# Patient Record
Sex: Male | Born: 1955 | Race: White | Hispanic: No | Marital: Married | Smoking: Never smoker
Health system: Southern US, Community
[De-identification: ages and names within clinical notes are randomized; demographics above are authoritative.]

## PROBLEM LIST (undated history)

## (undated) DIAGNOSIS — E119 Type 2 diabetes mellitus without complications: Secondary | ICD-10-CM

## (undated) HISTORY — PX: CERVICAL SPINE SURGERY: SHX589

## (undated) HISTORY — PX: BACK SURGERY: SHX140

---

## 2003-09-12 ENCOUNTER — Inpatient Hospital Stay (HOSPITAL_COMMUNITY): Admission: AC | Admit: 2003-09-12 | Discharge: 2003-09-15 | Payer: Self-pay

## 2003-09-15 ENCOUNTER — Inpatient Hospital Stay (HOSPITAL_COMMUNITY)
Admission: RE | Admit: 2003-09-15 | Discharge: 2003-10-21 | Payer: Self-pay | Admitting: Physical Medicine & Rehabilitation

## 2003-11-19 ENCOUNTER — Encounter
Admission: RE | Admit: 2003-11-19 | Discharge: 2004-02-17 | Payer: Self-pay | Admitting: Physical Medicine & Rehabilitation

## 2004-02-17 ENCOUNTER — Encounter
Admission: RE | Admit: 2004-02-17 | Discharge: 2004-05-17 | Payer: Self-pay | Admitting: Physical Medicine & Rehabilitation

## 2004-07-05 ENCOUNTER — Encounter
Admission: RE | Admit: 2004-07-05 | Discharge: 2004-10-03 | Payer: Self-pay | Admitting: Physical Medicine & Rehabilitation

## 2004-07-06 ENCOUNTER — Ambulatory Visit: Payer: Self-pay | Admitting: Physical Medicine & Rehabilitation

## 2004-10-31 ENCOUNTER — Encounter
Admission: RE | Admit: 2004-10-31 | Discharge: 2005-01-29 | Payer: Self-pay | Admitting: Physical Medicine & Rehabilitation

## 2004-12-28 ENCOUNTER — Ambulatory Visit: Payer: Self-pay | Admitting: Physical Medicine & Rehabilitation

## 2005-02-21 ENCOUNTER — Encounter
Admission: RE | Admit: 2005-02-21 | Discharge: 2005-05-22 | Payer: Self-pay | Admitting: Physical Medicine & Rehabilitation

## 2005-02-22 ENCOUNTER — Ambulatory Visit: Payer: Self-pay | Admitting: Physical Medicine & Rehabilitation

## 2005-09-24 IMAGING — CT CT CERVICAL SPINE W/O CM
2 of 5 series · 9 of 20 positions shown, 11 images · IV contrast (omnipaque)
Comparison: none

CLINICAL DATA: Trauma.  Paraplegia.
CERVICAL SPINE, ONE VIEW
Cross table lateral and swimmer?s views were obtained.
On the swimmer?s view, there is approximately  6 mm of anterior subluxation of C6 on C7 compatible with a  dislocation.  I don?t identify definite fractures on this view, but a CT scan is suggested.  The remainder of the alignment is normal.
IMPRESSION 
Subluxation of C6 on C7, most likely due to fracture or subluxation.  CT scanning suggested.
THORACIC SPINE, ONE VIEW
Cross table lateral views obtained on the backboard.  Normal alignment.  No fracture.
Negative.
CT BRAIN WITHOUT CONTRAST
Routine noncontrast head CT was performed.  No comparison.
There is no evidence of intracranial hemorrhage, brain edema, or mass effect.  The ventricles are normal.  No extra-axial fluid abnormalities are identified.  Bone window images show no significant abnormalities.
IMPRESSION
Negative noncontrast head CT.
CT OF THE CERVICAL SPINE
There is anterior subluxation of C6 on C7 of approximately 8 mm.  There is a fracture of the superior articulating facet of C7 with a jumped facet at C6-7 on the right.  The facet on the left is also jumped of C6 on C7 without definite fracture.  There is a moderately large osteophyte posteriorly at C7 projecting into the canal and further contributing to spinal stenosis.  There is also disc degeneration and spondylosis at C5-6.  No vertebral body fracture is seen.
8 mm of anterior subluxation of C6 on C7 with bilateral jumped facets and a fracture of the right facet at C6-7.  
Disc degeneration and spondylosis at C5-6 and C6-7.
CT MULTIPLANAR RECONSTRUCTION CERVICAL SPINE
Multiplanar reformatted CT images were reconstructed from the axial CT data set.  These images were reviewed and pertinent findings are included in the accompanying complete CT report.
See complete CT report.
CT ABDOMEN WITH CONTRAST
100 cc Omnipaque 300 IV.  No comparison.
Liver, spleen, pancreas, and kidneys are normal.  There is no free fluid.
CT PELVIS WITH CONTRAST
No comparison.
There is no free fluid.  The urinary bladder is distended.  There is no hematoma or adenopathy.
[REDACTED]

[Series 104: helical c-spine · axial · 0.39mm/px · z∈[-110,+73]mm · 8 of 380 slices shown, 10 images]
[im 43/380  soft-tissue]
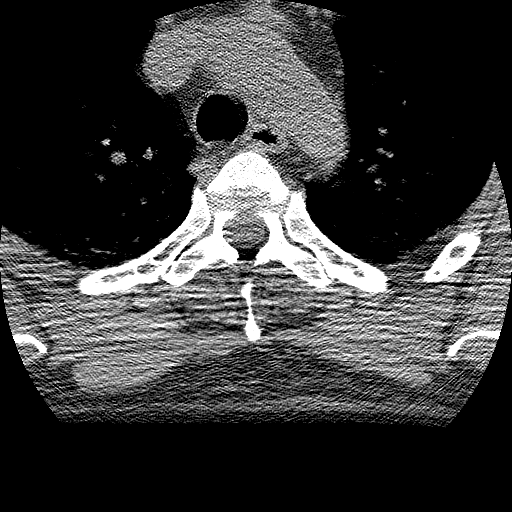
[im 43/380  bone]
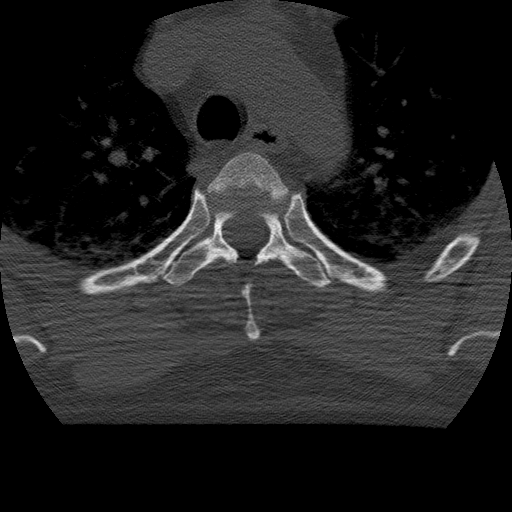
[im 85/380  bone]
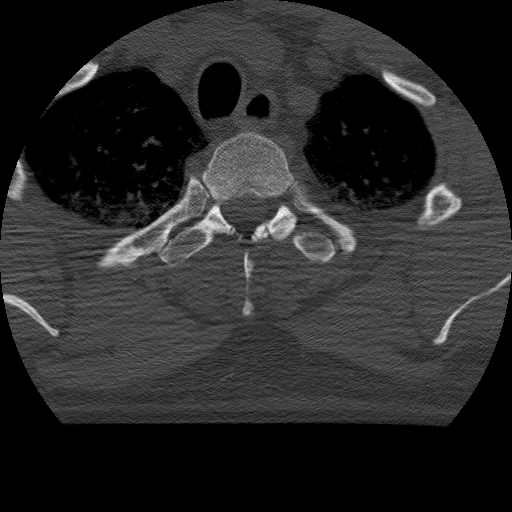
[im 127/380  bone]
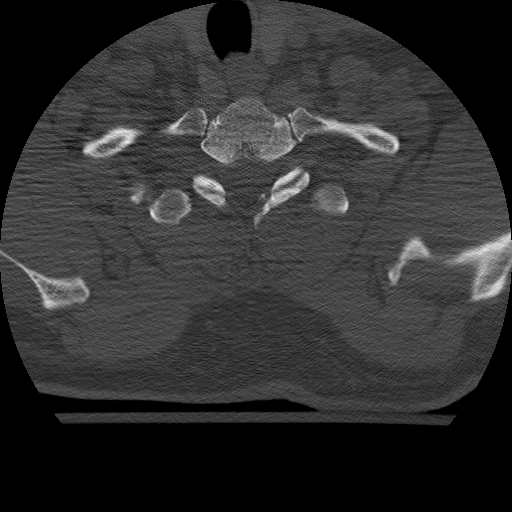
[im 169/380  bone]
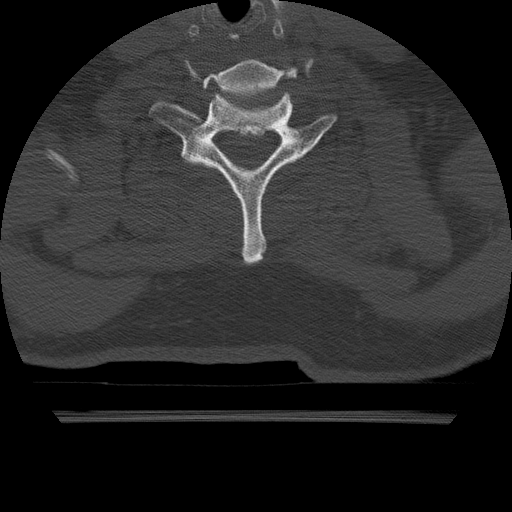
[im 211/380  soft-tissue]
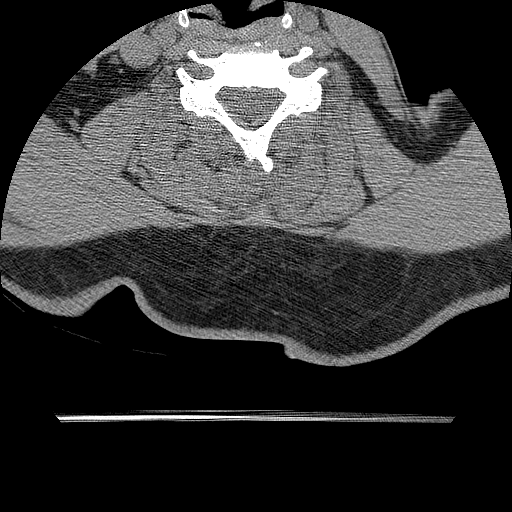
[im 211/380  bone]
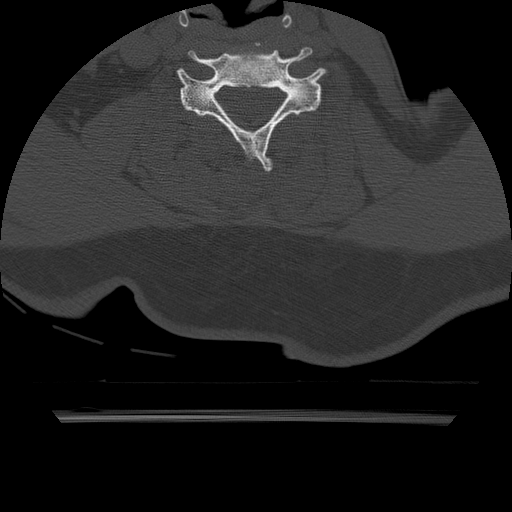
[im 253/380  bone]
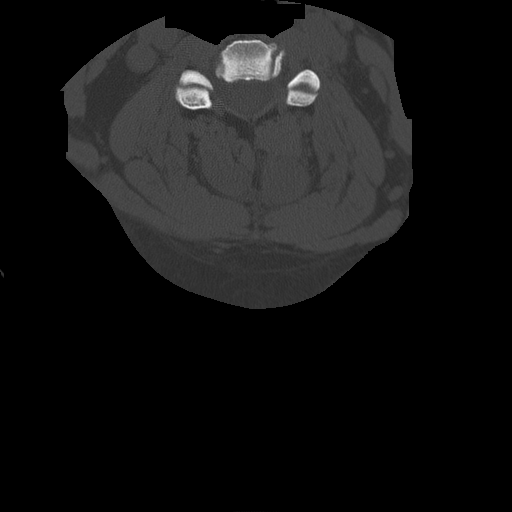
[im 295/380  bone]
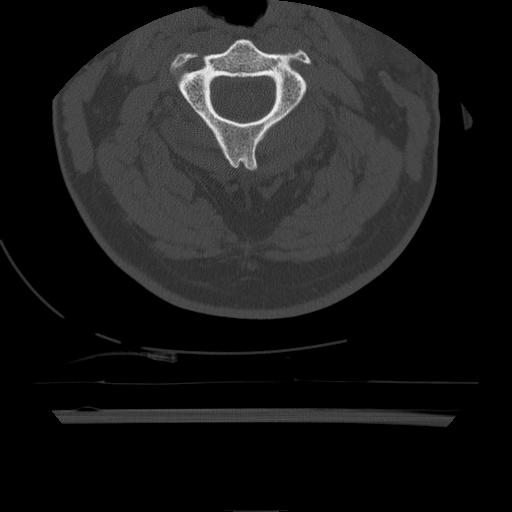
[im 337/380  bone]
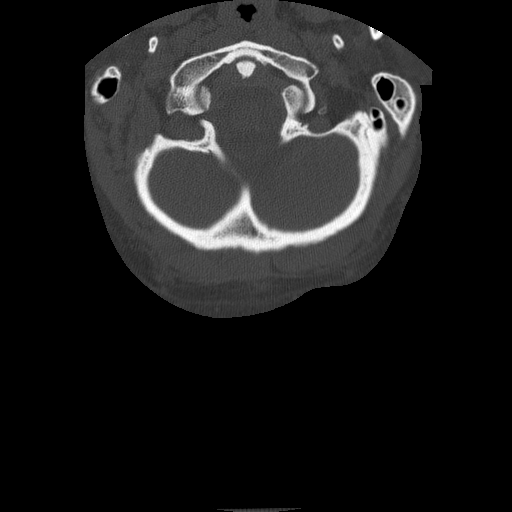

[Series 802: reformatted · coronal · 0.46mm/px · 1 of 40 slices shown]
[im 20/40  bone]
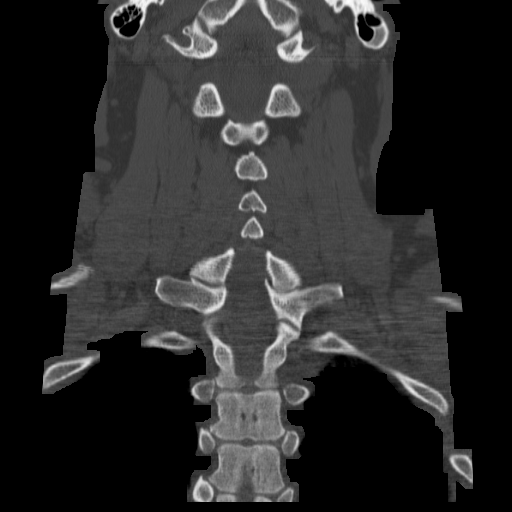

[9 of 20 positions shown; findings below may reference images not displayed]

## 2005-10-18 ENCOUNTER — Ambulatory Visit (HOSPITAL_BASED_OUTPATIENT_CLINIC_OR_DEPARTMENT_OTHER): Admission: RE | Admit: 2005-10-18 | Discharge: 2005-10-18 | Payer: Self-pay | Admitting: Urology

## 2019-02-17 ENCOUNTER — Encounter (HOSPITAL_COMMUNITY): Payer: Self-pay

## 2019-02-17 ENCOUNTER — Other Ambulatory Visit: Payer: Self-pay

## 2019-02-17 ENCOUNTER — Inpatient Hospital Stay (HOSPITAL_COMMUNITY)
Admission: EM | Admit: 2019-02-17 | Discharge: 2019-02-21 | DRG: 378 | Disposition: A | Discharge status: Home / Self Care | Payer: BC Managed Care – PPO | Attending: Internal Medicine | Admitting: Internal Medicine

## 2019-02-17 ENCOUNTER — Inpatient Hospital Stay (HOSPITAL_COMMUNITY): Payer: BC Managed Care – PPO

## 2019-02-17 DIAGNOSIS — I10 Essential (primary) hypertension: Secondary | ICD-10-CM | POA: Diagnosis present

## 2019-02-17 DIAGNOSIS — E611 Iron deficiency: Secondary | ICD-10-CM | POA: Diagnosis present

## 2019-02-17 DIAGNOSIS — K264 Chronic or unspecified duodenal ulcer with hemorrhage: Secondary | ICD-10-CM | POA: Diagnosis present

## 2019-02-17 DIAGNOSIS — Z7984 Long term (current) use of oral hypoglycemic drugs: Secondary | ICD-10-CM | POA: Diagnosis not present

## 2019-02-17 DIAGNOSIS — Z79899 Other long term (current) drug therapy: Secondary | ICD-10-CM

## 2019-02-17 DIAGNOSIS — E119 Type 2 diabetes mellitus without complications: Secondary | ICD-10-CM | POA: Diagnosis present

## 2019-02-17 DIAGNOSIS — I361 Nonrheumatic tricuspid (valve) insufficiency: Secondary | ICD-10-CM

## 2019-02-17 DIAGNOSIS — F419 Anxiety disorder, unspecified: Secondary | ICD-10-CM | POA: Diagnosis present

## 2019-02-17 DIAGNOSIS — R0989 Other specified symptoms and signs involving the circulatory and respiratory systems: Secondary | ICD-10-CM | POA: Diagnosis present

## 2019-02-17 DIAGNOSIS — E782 Mixed hyperlipidemia: Secondary | ICD-10-CM

## 2019-02-17 DIAGNOSIS — K449 Diaphragmatic hernia without obstruction or gangrene: Secondary | ICD-10-CM | POA: Diagnosis not present

## 2019-02-17 DIAGNOSIS — K921 Melena: Secondary | ICD-10-CM

## 2019-02-17 DIAGNOSIS — K259 Gastric ulcer, unspecified as acute or chronic, without hemorrhage or perforation: Secondary | ICD-10-CM | POA: Diagnosis present

## 2019-02-17 DIAGNOSIS — R0789 Other chest pain: Secondary | ICD-10-CM | POA: Diagnosis present

## 2019-02-17 DIAGNOSIS — G8929 Other chronic pain: Secondary | ICD-10-CM | POA: Diagnosis present

## 2019-02-17 DIAGNOSIS — R079 Chest pain, unspecified: Secondary | ICD-10-CM

## 2019-02-17 DIAGNOSIS — Z7982 Long term (current) use of aspirin: Secondary | ICD-10-CM

## 2019-02-17 DIAGNOSIS — D62 Acute posthemorrhagic anemia: Secondary | ICD-10-CM | POA: Diagnosis present

## 2019-02-17 DIAGNOSIS — K219 Gastro-esophageal reflux disease without esophagitis: Secondary | ICD-10-CM | POA: Diagnosis present

## 2019-02-17 DIAGNOSIS — R531 Weakness: Secondary | ICD-10-CM | POA: Diagnosis present

## 2019-02-17 DIAGNOSIS — F329 Major depressive disorder, single episode, unspecified: Secondary | ICD-10-CM | POA: Diagnosis present

## 2019-02-17 DIAGNOSIS — K922 Gastrointestinal hemorrhage, unspecified: Secondary | ICD-10-CM | POA: Diagnosis not present

## 2019-02-17 DIAGNOSIS — R072 Precordial pain: Secondary | ICD-10-CM | POA: Diagnosis not present

## 2019-02-17 DIAGNOSIS — Z87891 Personal history of nicotine dependence: Secondary | ICD-10-CM | POA: Diagnosis not present

## 2019-02-17 DIAGNOSIS — K5900 Constipation, unspecified: Secondary | ICD-10-CM | POA: Diagnosis not present

## 2019-02-17 DIAGNOSIS — I340 Nonrheumatic mitral (valve) insufficiency: Secondary | ICD-10-CM

## 2019-02-17 DIAGNOSIS — Z1159 Encounter for screening for other viral diseases: Secondary | ICD-10-CM

## 2019-02-17 DIAGNOSIS — M545 Low back pain: Secondary | ICD-10-CM | POA: Diagnosis present

## 2019-02-17 DIAGNOSIS — D649 Anemia, unspecified: Secondary | ICD-10-CM

## 2019-02-17 HISTORY — DX: Type 2 diabetes mellitus without complications: E11.9

## 2019-02-17 LAB — CBC WITH DIFFERENTIAL/PLATELET
Abs Immature Granulocytes: 0.08 10*3/uL — ABNORMAL HIGH (ref 0.00–0.07)
Basophils Absolute: 0 10*3/uL (ref 0.0–0.1)
Basophils Relative: 0 %
Eosinophils Absolute: 0 10*3/uL (ref 0.0–0.5)
Eosinophils Relative: 0 %
HCT: 16.4 % — ABNORMAL LOW (ref 39.0–52.0)
Hemoglobin: 4.7 g/dL — CL (ref 13.0–17.0)
Immature Granulocytes: 1 %
Lymphocytes Relative: 13 %
Lymphs Abs: 0.9 10*3/uL (ref 0.7–4.0)
MCH: 29.4 pg (ref 26.0–34.0)
MCHC: 28.7 g/dL — ABNORMAL LOW (ref 30.0–36.0)
MCV: 102.5 fL — ABNORMAL HIGH (ref 80.0–100.0)
Monocytes Absolute: 0.5 10*3/uL (ref 0.1–1.0)
Monocytes Relative: 7 %
Neutro Abs: 5.4 10*3/uL (ref 1.7–7.7)
Neutrophils Relative %: 79 %
Platelets: 205 10*3/uL (ref 150–400)
RBC: 1.6 MIL/uL — ABNORMAL LOW (ref 4.22–5.81)
RDW: 20.7 % — ABNORMAL HIGH (ref 11.5–15.5)
WBC: 6.9 10*3/uL (ref 4.0–10.5)
nRBC: 0.4 % — ABNORMAL HIGH (ref 0.0–0.2)

## 2019-02-17 LAB — ECHOCARDIOGRAM LIMITED
Height: 74 in
Weight: 2805 oz

## 2019-02-17 LAB — URINALYSIS, ROUTINE W REFLEX MICROSCOPIC
Bilirubin Urine: NEGATIVE
Glucose, UA: >=500 mg/dL — AB
Hgb urine dipstick: NEGATIVE
Ketones, ur: NEGATIVE mg/dL
Nitrite: NEGATIVE
Protein, ur: NEGATIVE mg/dL
Specific Gravity, Urine: 1.015 (ref 1.005–1.030)
pH: 5 (ref 5.0–8.0)

## 2019-02-17 LAB — COMPREHENSIVE METABOLIC PANEL
ALT: 10 U/L (ref 0–44)
AST: 12 U/L — ABNORMAL LOW (ref 15–41)
Albumin: 2.8 g/dL — ABNORMAL LOW (ref 3.5–5.0)
Alkaline Phosphatase: 55 U/L (ref 38–126)
Anion gap: 9 (ref 5–15)
BUN: 16 mg/dL (ref 8–23)
CO2: 21 mmol/L — ABNORMAL LOW (ref 22–32)
Calcium: 8 mg/dL — ABNORMAL LOW (ref 8.9–10.3)
Chloride: 105 mmol/L (ref 98–111)
Creatinine, Ser: 0.7 mg/dL (ref 0.61–1.24)
GFR calc Af Amer: >60 mL/min (ref >60)
GFR calc non Af Amer: >60 mL/min (ref >60)
Glucose, Bld: 337 mg/dL — ABNORMAL HIGH (ref 70–99)
Potassium: 3.9 mmol/L (ref 3.5–5.1)
Sodium: 135 mmol/L (ref 135–145)
Total Bilirubin: 0.2 mg/dL — ABNORMAL LOW (ref 0.3–1.2)
Total Protein: 5 g/dL — ABNORMAL LOW (ref 6.5–8.1)

## 2019-02-17 LAB — PROTIME-INR
INR: 1.1 (ref 0.8–1.2)
Prothrombin Time: 14.4 seconds (ref 11.4–15.2)

## 2019-02-17 LAB — TROPONIN I
Troponin I: <0.03 ng/mL (ref <0.03)
Troponin I: <0.03 ng/mL (ref <0.03)
Troponin I: <0.03 ng/mL (ref <0.03)

## 2019-02-17 LAB — ABO/RH: ABO/RH(D): A POS

## 2019-02-17 LAB — IRON AND TIBC
Iron: 21 ug/dL — ABNORMAL LOW (ref 45–182)
Saturation Ratios: 6 % — ABNORMAL LOW (ref 17.9–39.5)
TIBC: 366 ug/dL (ref 250–450)
UIBC: 345 ug/dL

## 2019-02-17 LAB — HEMOGLOBIN A1C
Hgb A1c MFr Bld: 6.2 % — ABNORMAL HIGH (ref 4.8–5.6)
Mean Plasma Glucose: 131.24 mg/dL

## 2019-02-17 LAB — PREPARE RBC (CROSSMATCH)

## 2019-02-17 LAB — FERRITIN: Ferritin: 9 ng/mL — ABNORMAL LOW (ref 24–336)

## 2019-02-17 LAB — POC OCCULT BLOOD, ED: Fecal Occult Bld: POSITIVE — AB

## 2019-02-17 LAB — CBG MONITORING, ED: Glucose-Capillary: 350 mg/dL — ABNORMAL HIGH (ref 70–99)

## 2019-02-17 LAB — SALICYLATE LEVEL: Salicylate Lvl: <7 mg/dL (ref 2.8–30.0)

## 2019-02-17 LAB — GLUCOSE, CAPILLARY: Glucose-Capillary: 128 mg/dL — ABNORMAL HIGH (ref 70–99)

## 2019-02-17 LAB — FOLATE: Folate: 11.1 ng/mL (ref >5.9)

## 2019-02-17 LAB — SARS CORONAVIRUS 2 BY RT PCR (HOSPITAL ORDER, PERFORMED IN ~~LOC~~ HOSPITAL LAB): SARS Coronavirus 2: NEGATIVE

## 2019-02-17 LAB — VITAMIN B12: Vitamin B-12: 121 pg/mL — ABNORMAL LOW (ref 180–914)

## 2019-02-17 LAB — APTT: aPTT: 27 seconds (ref 24–36)

## 2019-02-17 MED ORDER — LACTATED RINGERS IV SOLN
INTRAVENOUS | Status: DC
  Administered 2019-02-17 – 2019-02-19 (×3): via INTRAVENOUS

## 2019-02-17 MED ORDER — PANTOPRAZOLE SODIUM 40 MG IV SOLR
40.0000 mg | Freq: Once | INTRAVENOUS | Status: AC
  Administered 2019-02-17: 40 mg via INTRAVENOUS
  Filled 2019-02-17: qty 40

## 2019-02-17 MED ORDER — INSULIN ASPART 100 UNIT/ML ~~LOC~~ SOLN
0.0000 [IU] | Freq: Three times a day (TID) | SUBCUTANEOUS | Status: DC
  Administered 2019-02-17: 1 [IU] via SUBCUTANEOUS
  Administered 2019-02-18: 2 [IU] via SUBCUTANEOUS

## 2019-02-17 MED ORDER — TAMSULOSIN HCL 0.4 MG PO CAPS
0.4000 mg | ORAL_CAPSULE | Freq: Every day | ORAL | Status: DC
  Administered 2019-02-18 – 2019-02-21 (×4): 0.4 mg via ORAL
  Filled 2019-02-17 (×4): qty 1

## 2019-02-17 MED ORDER — FAMOTIDINE IN NACL 20-0.9 MG/50ML-% IV SOLN
20.0000 mg | INTRAVENOUS | Status: AC
  Administered 2019-02-17: 20 mg via INTRAVENOUS
  Filled 2019-02-17: qty 50

## 2019-02-17 MED ORDER — ACETAMINOPHEN 325 MG PO TABS
650.0000 mg | ORAL_TABLET | Freq: Four times a day (QID) | ORAL | Status: DC | PRN
  Administered 2019-02-17 – 2019-02-20 (×3): 650 mg via ORAL
  Filled 2019-02-17 (×3): qty 2

## 2019-02-17 MED ORDER — ONDANSETRON HCL 4 MG PO TABS: 4.0000 mg | ORAL_TABLET | Freq: Four times a day (QID) | ORAL | Status: DC | PRN

## 2019-02-17 MED ORDER — DIAZEPAM 5 MG PO TABS
10.0000 mg | ORAL_TABLET | Freq: Every day | ORAL | Status: DC
  Administered 2019-02-17 – 2019-02-20 (×3): 10 mg via ORAL
  Filled 2019-02-17 (×3): qty 2

## 2019-02-17 MED ORDER — ONDANSETRON HCL 4 MG/2ML IJ SOLN: 4.0000 mg | Freq: Four times a day (QID) | INTRAMUSCULAR | Status: DC | PRN

## 2019-02-17 MED ORDER — PANTOPRAZOLE SODIUM 40 MG IV SOLR
40.0000 mg | Freq: Two times a day (BID) | INTRAVENOUS | Status: DC
  Administered 2019-02-17 – 2019-02-18 (×2): 40 mg via INTRAVENOUS
  Filled 2019-02-17 (×3): qty 40

## 2019-02-17 MED ORDER — ATORVASTATIN CALCIUM 20 MG PO TABS
20.0000 mg | ORAL_TABLET | Freq: Every day | ORAL | Status: DC
  Administered 2019-02-18 – 2019-02-21 (×4): 20 mg via ORAL
  Filled 2019-02-17 (×4): qty 1

## 2019-02-17 MED ORDER — SODIUM CHLORIDE 0.9% IV SOLUTION
Freq: Once | INTRAVENOUS | Status: AC
  Administered 2019-02-17: 10 mL/h via INTRAVENOUS

## 2019-02-17 MED ORDER — SODIUM CHLORIDE 0.9 % IV BOLUS
1000.0000 mL | Freq: Once | INTRAVENOUS | Status: AC
  Administered 2019-02-17: 1000 mL via INTRAVENOUS

## 2019-02-17 MED ORDER — SODIUM CHLORIDE 0.9% FLUSH: 3.0000 mL | Freq: Once | INTRAVENOUS | Status: DC

## 2019-02-17 MED ORDER — FERROUS SULFATE 325 (65 FE) MG PO TABS
325.0000 mg | ORAL_TABLET | Freq: Two times a day (BID) | ORAL | Status: DC
  Administered 2019-02-17 – 2019-02-18 (×2): 325 mg via ORAL
  Filled 2019-02-17 (×2): qty 1

## 2019-02-17 MED ORDER — ACETAMINOPHEN 650 MG RE SUPP: 650.0000 mg | Freq: Four times a day (QID) | RECTAL | Status: DC | PRN

## 2019-02-17 NOTE — Progress Notes (Signed)
  Echocardiogram 2D Echocardiogram has been performed.  Delcie Roch 02/17/2019, 5:07 PM

## 2019-02-17 NOTE — Consult Note (Signed)
Referring Provider: Dr. Hyacinth Meeker, [facility] ED Primary Care Physician:  Patient, No Pcp Per Primary Gastroenterologist:  Dr. Jena Gauss  Date of Admission: 02/17/19 Date of Consultation: 02/17/19  Reason for Consultation:  Profound anemia, melena, hematemesis  HPI:  Joel Roberson is a 63 y.o. year old male presenting to the ED today with fatigue, reports of melena and hematemesis, found to have profound anemia with Hgb 4.7.   States he had noted for approximately several months black stool and recalls being told his iron was low in January. He was started on iron and increased to BID per PCP. Outside records from [facility] with Hgb 11.4 in Jan 2020 and ferritin 11. He notes worsening fatigue, weakness, difficulty standing. Notes Last week black emesis. Three episodes.  No DOE. Denies SOB. Weak. Uneasy on feet. No abdominal pain currently. Epigastric pain intermittently but "not that bad" prior to admission. Endorses 5-6 Goody powders a day chronically due to chronic lower back pain. Took a goody powder this morning. No prior history of ulcers. No prior EGD or colonoscopy. No chronic GERD. Last episode of melena Sunday. Last emesis Saturday night.   Past Medical History:  Diagnosis Date  . Diabetes mellitus without complication [facility])     Past Surgical History:  Procedure Laterality Date  . BACK SURGERY    . CERVICAL SPINE SURGERY     cow charged him    Prior to Admission medications   Medication Sig Start Date End Date Taking? Authorizing Provider  Aspirin-Acetaminophen-Caffeine [id number] MG PACK Take 1 packet by mouth daily.   Yes [provider]  atorvastatin (LIPITOR) 20 MG tablet Take 20 mg by mouth daily. 12/29/18  Yes [provider]  citalopram (CELEXA) 40 MG tablet Take 40 mg by mouth daily. 12/30/18  Yes [provider]  diazepam (VALIUM) 10 MG tablet Take 10 mg by mouth at bedtime. 02/08/19  Yes [provider]  Ferrous Sulfate (IRON) 325 (65 Fe)  MG TABS Take 1 tablet by mouth 2 (two) times daily with a meal. 01/18/19  Yes [provider]  lisinopril (ZESTRIL) 10 MG tablet Take 10 mg by mouth daily. 12/19/18  Yes [provider]  metFORMIN (GLUCOPHAGE) 1000 MG tablet Take 1,000 mg by mouth 2 (two) times daily. 01/01/19  Yes [provider]  tamsulosin (FLOMAX) 0.4 MG CAPS capsule Take 0.4 mg by mouth daily. 12/19/18  Yes [provider]    Current Facility-Administered Medications  Medication Dose Route Frequency Provider Last Rate Last Dose  . 0.9 %  sodium chloride infusion (Manually program via Guardrails IV Fluids)   Intravenous Once Eber Hong, MD      . famotidine (PEPCID) IVPB 20 mg premix  20 mg Intravenous STAT Eber Hong, MD      . pantoprazole (PROTONIX) injection 40 mg  40 mg Intravenous Once Eber Hong, MD      . sodium chloride flush (NS) 0.9 % injection 3 mL  3 mL Intravenous Once Eber Hong, MD       Current Outpatient Medications  Medication Sig Dispense Refill  . Aspirin-Acetaminophen-Caffeine 500-325-65 MG PACK Take 1 packet by mouth daily.    Marland Kitchen atorvastatin (LIPITOR) 20 MG tablet Take 20 mg by mouth daily.    . citalopram (CELEXA) 40 MG tablet Take 40 mg by mouth daily.    . diazepam (VALIUM) 10 MG tablet Take 10 mg by mouth at bedtime.    . Ferrous Sulfate (IRON) 325 (65 Fe) MG TABS Take 1  tablet by mouth 2 (two) times daily with a meal.    . lisinopril (ZESTRIL) 10 MG tablet Take 10 mg by mouth daily.    . metFORMIN (GLUCOPHAGE) 1000 MG tablet Take 1,000 mg by mouth 2 (two) times daily.    . tamsulosin (FLOMAX) 0.4 MG CAPS capsule Take 0.4 mg by mouth daily.      Allergies as of 02/17/2019  . (No Known Allergies)    Family History  Problem Relation Age of Onset  . Colon polyps Neg Hx   . Colon cancer Neg Hx     Social History   Socioeconomic History  . Marital status: Married    Spouse name: Not on file  . Number of children: Not on file  . Years of  education: Not on file  . Highest education level: Not on file  Occupational History  . Not on file  Social Needs  . Financial resource strain: Not on file  . Food insecurity:    Worry: Not on file    Inability: Not on file  . Transportation needs:    Medical: Not on file    Non-medical: Not on file  Tobacco Use  . Smoking status: Never Smoker  . Smokeless tobacco: Never Used  Substance and Sexual Activity  . Alcohol use: Never    Frequency: Never  . Drug use: Not Currently  . Sexual activity: Not on file  Lifestyle  . Physical activity:    Days per week: Not on file    Minutes per session: Not on file  . Stress: Not on file  Relationships  . Social connections:    Talks on phone: Not on file    Gets together: Not on file    Attends religious service: Not on file    Active member of club or organization: Not on file    Attends meetings of clubs or organizations: Not on file    Relationship status: Not on file  . Intimate partner violence:    Fear of current or ex partner: Not on file    Emotionally abused: Not on file    Physically abused: Not on file    Forced sexual activity: Not on file  Other Topics Concern  . Not on file  Social History Narrative  . Not on file    Review of Systems: Gen: see HPI CV: Denies chest pain, heart palpitations, syncope, edema  Resp: Denies shortness of breath with rest, cough, wheezing GI: see HPI GU : Denies urinary burning, urinary frequency, urinary incontinence.  MS: see HPI Derm: Denies rash, itching, dry skin Psych: Denies depression, anxiety,confusion, or memory loss Heme: see HPI  Physical Exam: Vital signs in last 24 hours: Temp:  [98.1 F (36.7 C)] 98.1 F (36.7 C) (05/11 0939) Pulse Rate:  [94-107] 98 (05/11 1154) Resp:  [10-21] 18 (05/11 1154) BP: (104-136)/(51-66) 133/64 (05/11 1154) SpO2:  [97 %-100 %] 100 % (05/11 1154) Weight:  [81.6 kg] 81.6 kg (05/11 0939)   General:   Alert,  Well-developed,  well-nourished, pleasant and cooperative, pale Head:  Normocephalic and atraumatic. Eyes:  Sclera clear, no icterus.   Ears:  Normal auditory acuity. Lungs:  Clear throughout to auscultation.    Heart:  S1 S2 present with systolic murmur best appreciated at the apex Abdomen:  Soft, nontender and nondistended. No masses, hepatosplenomegaly or hernias noted. Normal bowel sounds, without guarding, and without rebound. Abdominal bruit left mid abdomen. Rectal:  Deferred until time of colonoscopy.  Msk:  Symmetrical without gross deformities.  Extremities:  Without edema. Neurologic:  Alert and  oriented x4 Psych:  Alert and cooperative. Normal mood and affect.  Intake/Output from previous day: No intake/output data recorded. Intake/Output this shift: Total I/O In: 1000 [IV Piggyback:1000] Out: -   Lab Results: Recent Labs    02/17/19 1051  WBC 6.9  HGB 4.7*  HCT 16.4*  PLT 205   BMET Recent Labs    02/17/19 1051  NA 135  K 3.9  CL 105  CO2 21*  GLUCOSE 337*  BUN 16  CREATININE 0.70  CALCIUM 8.0*   LFT Recent Labs    02/17/19 1051  PROT 5.0*  ALBUMIN 2.8*  AST 12*  ALT 10  ALKPHOS 55  BILITOT 0.2*   PT/INR Recent Labs    02/17/19 1051  LABPROT 14.4  INR 1.1    Impression: 63 year old very pleasant male presenting with profound symptomatic anemia with Hgb 4.7, reports of hematemesis and melena prior to admission, and endorsing chronic use of Goody powders up to 5-6 times per day in setting of chronic back pain. No prior history of PUD or prior EGD. Blood transfusion has been ordered. Hemodynamically stable.   Will pursue diagnostic EGD on 02/18/2019 by Dr. Jena Gaussourk. Risks and benefits discussed with patient with stated understanding. May have clear liquids today and NPO after midnight except sips with meds. Continue to transfuse as needed and follow H/H.   Discussed absolute avoidance of NSAIDs going forward.  Abdominal bruit: noted incidentally on physical  exam. Outpatient ultrasound electively.   Plan: Clears today NPO after midnight except sips with meds.  Protonix IV BID Follow H/H Transfuse as necessary EGD with Dr. Jena Gaussourk on 02/18/2019 Avoidance of NSAIDs   Gelene MinkAnna W. Jakaleb Payer, PhD, ANP-BC [facility]     LOS: 0 days    02/17/2019, 12:04 PM

## 2019-02-17 NOTE — ED Triage Notes (Signed)
Pt brought in by EMS due to weakness for 1 1/2 week. And dizziness when standing. Reports when he has sweaty last night. Experiencing CP intermittently and takes goody powder daily. Reports vomiting blood sat night and blood in stool Friday night Denies cp or abdominal pain at this time

## 2019-02-17 NOTE — ED Notes (Signed)
GI at bedside. Will medicate when they are finished assessing.

## 2019-02-17 NOTE — ED Notes (Signed)
CRITICAL VALUE ALERT  Critical Value:  Hemoglobin 4.7  Date & Time Notied:  02/17/2019, 1120  Provider Notified: Dr. Hyacinth Meeker  Orders Received/Actions taken: see chart

## 2019-02-17 NOTE — H&P (Signed)
History and Physical  EVAGELOS CERNEY Roberson:[id number] DOB: [birth year] DOA: 02/17/2019   PCP: Patient, No Pcp Per   Patient coming from: Home  Chief Complaint: generalized weakness, dizziness  HPI:  Joel Roberson is a 63 y.o. male with medical history of diabetes mellitus type 2, hypertension, hyperlipidemia, depression, iron deficiency presenting with generalized weakness and dizziness that has gradually worsened over the past 1-1/2 weeks.  The patient states that he has been taking Goody powders on a daily basis for a number of years.  In the past 1 to 2 weeks he has noted melanotic stools.  The patient had an episode of hematemesis and coffee-ground emesis on 02/15/2019.  In addition, the patient has had intermittent hematochezia for the past 1-1/2 weeks with the last episode on 02/14/2019.  He is complaining of worsening generalized weakness and lightheadedness.  He has had intermittent chest discomfort at rest as well as with exertion.  He denies any headaches, fevers, chills, abdominal pain, dysuria, hematuria.  He has remote history of tobacco, but has not smoked in over 20 years.  He denies any alcohol use.  He has never had endoscopy. In the emergency department, the patient was afebrile hemodynamically stable saturating 100% room air.  BMP and LFTs were unremarkable.  Hemoglobin was 4.7.  GI was consulted to assist with management.  Assessment/Plan: Acute blood loss anemia -Secondary GI bleed -Suspect NSAID induced peptic ulcer disease -Transfuse 1 unit PRBC in the emergency department  Hematochezia and melena/hematemesis -GI consulted -Planning for endoscopy 02/18/2019 -Start Protonix bid -Clear liquid diet -Discontinue all NSAIDs  Chest pain -Sounds mostly atypical -Cycle troponins -Echocardiogram  Diabetes mellitus type 2 -Hemoglobin A1c -Holding metformin -NovoLog sliding scale  Essential hypertension -Restart lisinopril  Hyperlipidemia -Restart statin   Anxiety/depression -Restart home dose Celexa and Valium        Past Medical History:  Diagnosis Date  . Diabetes mellitus without complication [facility])    Past Surgical History:  Procedure Laterality Date  . BACK SURGERY    . CERVICAL SPINE SURGERY     cow charged him   Social History:  reports that he has never smoked. He has never used smokeless tobacco. He reports previous drug use. He reports that he does not drink alcohol.   Family History  Problem Relation Age of Onset  . Colon polyps Neg Hx   . Colon cancer Neg Hx      No Known Allergies   Prior to Admission medications   Medication Sig Start Date End Date Taking? Authorizing Provider  Aspirin-Acetaminophen-Caffeine [phone] MG PACK Take 1 packet by mouth daily.   Yes [provider]  atorvastatin (LIPITOR) 20 MG tablet Take 20 mg by mouth daily. 12/29/18  Yes [provider]  citalopram (CELEXA) 40 MG tablet Take 40 mg by mouth daily. 12/30/18  Yes [provider]  diazepam (VALIUM) 10 MG tablet Take 10 mg by mouth at bedtime. 02/08/19  Yes [provider]  Ferrous Sulfate (IRON) 325 (65 Fe) MG TABS Take 1 tablet by mouth 2 (two) times daily with a meal. 01/18/19  Yes [provider]  lisinopril (ZESTRIL) 10 MG tablet Take 10 mg by mouth daily. 12/19/18  Yes [provider]  metFORMIN (GLUCOPHAGE) 1000 MG tablet Take 1,000 mg by mouth 2 (two) times daily. 01/01/19  Yes [provider]  tamsulosin (FLOMAX) 0.4 MG CAPS capsule Take 0.4 mg by mouth daily. 12/19/18  Yes [provider]  Review of Systems:  Constitutional:  No weight loss, night sweats, Fevers, chills, fatigue.  Head&Eyes: No headache.  No vision loss.  No eye pain or scotoma ENT:  No Difficulty swallowing,Tooth/dental problems,Sore throat,  No ear ache, post nasal drip,  Cardio-vascular:  No  Orthopnea, PND, swelling in lower extremities,  dizziness, palpitations  GI:  No   abdominal paindiarrhea, loss of appetite, hematochezia, indigestion, Resp:  No shortness of breath with exertion or at rest. No cough. No coughing up of blood .No wheezing.No chest wall deformity  Skin:  no rash or lesions.  GU:  no dysuria, change in color of urine, no urgency or frequency. No flank pain.  Musculoskeletal:  No joint pain or swelling. No decreased range of motion. No back pain.  Psych:  No change in mood or affect. No depression or anxiety. Neurologic: No headache, no dysesthesia, no focal weakness, no vision loss. No syncope  Physical Exam: Vitals:   02/17/19 0941 02/17/19 0942 02/17/19 1000 02/17/19 1154  BP: 136/66 (!) 104/51 123/60 133/64  Pulse:  (!) 107 94 98  Resp:  13 10 18   Temp:      TempSrc:      SpO2:  99% 97% 100%  Weight:      Height:       General:  A&O x 3, NAD, nontoxic, pleasant/cooperative Head/Eye: No conjunctival hemorrhage, no icterus, [place]/AT, No nystagmus ENT:  No icterus,  No thrush, good dentition, no pharyngeal exudate Neck:  No masses, no lymphadenpathy, no bruits CV:  RRR, no rub, no gallop, no S3 Lung:  CTAB, good air movement, no wheeze, no rhonchi Abdomen: soft/NT, +BS, nondistended, no peritoneal signs Ext: No cyanosis, No rashes, No petechiae, No lymphangitis, No edema Neuro: CNII-XII intact, strength 4/5 in bilateral upper and lower extremities, no dysmetria  Labs on Admission:  Basic Metabolic Panel: Recent Labs  Lab 02/17/19 1051  NA 135  K 3.9  CL 105  CO2 21*  GLUCOSE 337*  BUN 16  CREATININE 0.70  CALCIUM 8.0*   Liver Function Tests: Recent Labs  Lab 02/17/19 1051  AST 12*  ALT 10  ALKPHOS 55  BILITOT 0.2*  PROT 5.0*  ALBUMIN 2.8*   No results for input(s): LIPASE, AMYLASE in the last 168 hours. No results for input(s): AMMONIA in the last 168 hours. CBC: Recent Labs  Lab 02/17/19 1051  WBC 6.9  NEUTROABS 5.4  HGB 4.7*  HCT 16.4*  MCV 102.5*  PLT 205   Coagulation Profile: Recent Labs   Lab 02/17/19 1051  INR 1.1   Cardiac Enzymes: Recent Labs  Lab 02/17/19 1051  TROPONINI <0.03   BNP: Invalid input(s): POCBNP CBG: Recent Labs  Lab 02/17/19 0951  GLUCAP 350*   Urine analysis:    Component Value Date/Time   COLORURINE YELLOW 02/17/2019 0945   APPEARANCEUR HAZY (A) 02/17/2019 0945   LABSPEC 1.015 02/17/2019 0945   PHURINE 5.0 02/17/2019 0945   GLUCOSEU >=500 (A) 02/17/2019 0945   HGBUR NEGATIVE 02/17/2019 0945   BILIRUBINUR NEGATIVE 02/17/2019 0945   KETONESUR NEGATIVE 02/17/2019 0945   PROTEINUR NEGATIVE 02/17/2019 0945   NITRITE NEGATIVE 02/17/2019 0945   LEUKOCYTESUR TRACE (A) 02/17/2019 0945   Sepsis Labs: @LABRCNTIP (procalcitonin:4,lacticidven:4) )No results found for this or any previous visit (from the past 240 hour(s)).   Radiological Exams on Admission: No results found.  EKG: Independently reviewed. Sinus, no STT changes    Time spent:60 minutes Code Status:  FULL Family Communication:  No Family at  bedside Disposition Plan: expect 1-2 day hospitalization Consults called: GI DVT Prophylaxis: SCDs Catarina Hartshorn, DO  [facility] Pager [phone]  If 7PM-7AM, please contact night-coverage [email or web address] Password [email or web address] 02/17/2019, 12:56 PM

## 2019-02-17 NOTE — ED Provider Notes (Signed)
[facility] EMERGENCY DEPARTMENT Provider Note   CSN: [id number] Arrival date & time: 02/17/19  0930    History   Chief Complaint Chief Complaint  Patient presents with  . Weakness    HPI Joel Roberson is a 63 y.o. male.     HPI  The patient is a pleasant 63 year old male, he is a known diabetic who states that he takes daily metformin.  He denies taking anything for his blood pressure though it is listed in his record that he is taking lisinopril.  He is also known to have anemia which was recently worked up back in December back when he was having colon cancer screening.  He was told that his initial sample came back negative for blood.  He states that he has some chronic low back pain as well as some intermittent chest pains and reports that over the last week and a half he has been having some black appearing vomit as well as some black stools which is new.  He takes approximately 3-5 Goody powders a day for his lower back pain, he has never had a history of stomach ulcers though he does have some acid reflux.  He denies abdominal pain, he does endorse that he is severely weak and fatigued and short of breath when he tries to walk across the room which has aggressively worsened over the last week.  The symptoms are persistent, gradually worsening, not associated with any bright red blood.  He has been vomiting some black material as well.  Past Medical History:  Diagnosis Date  . Diabetes mellitus without complication (HCC)     There are no active problems to display for this patient.   Past Surgical History:  Procedure Laterality Date  . BACK SURGERY          Home Medications    Prior to Admission medications   Medication Sig Start Date End Date Taking? Authorizing Provider  atorvastatin (LIPITOR) 20 MG tablet Take 20 mg by mouth daily. 12/29/18   [provider]  citalopram (CELEXA) 40 MG tablet Take 40 mg by mouth daily. 12/30/18   [provider]   diazepam (VALIUM) 10 MG tablet Take 10 mg by mouth at bedtime. 02/08/19   [provider]  Ferrous Sulfate (IRON) 325 (65 Fe) MG TABS Take 1 tablet by mouth 2 (two) times daily with a meal. 01/18/19   [provider]  lisinopril (ZESTRIL) 10 MG tablet Take 10 mg by mouth daily. 12/19/18   [provider]  metFORMIN (GLUCOPHAGE) 1000 MG tablet Take 1,000 mg by mouth 2 (two) times daily. 01/01/19   [provider]  tamsulosin (FLOMAX) 0.4 MG CAPS capsule Take 0.4 mg by mouth daily. 12/19/18   [provider]    Family History No family history on file.  Social History Social History   Tobacco Use  . Smoking status: Never Smoker  . Smokeless tobacco: Never Used  Substance Use Topics  . Alcohol use: Never    Frequency: Never  . Drug use: Not Currently     Allergies   Patient has no known allergies.   Review of Systems Review of Systems  All other systems reviewed and are negative.    Physical Exam Updated Vital Signs BP 136/66 (BP Location: Right Arm)   Pulse (!) 106   Temp 98.1 F (36.7 C) (Oral)   Resp (!) 21   Ht 1.88 m (6\' 2" )   Wt 81.6 kg   SpO2  99%   BMI 23.11 kg/m   Physical Exam Vitals signs and nursing note reviewed.  Constitutional:      General: He is not in acute distress.    Appearance: He is well-developed.  HENT:     Head: Normocephalic and atraumatic.     Mouth/Throat:     Pharynx: No oropharyngeal exudate.  Eyes:     General: No scleral icterus.       Right eye: No discharge.        Left eye: No discharge.     Pupils: Pupils are equal, round, and reactive to light.     Comments: Pale conjunctiva  Neck:     Musculoskeletal: Normal range of motion and neck supple.     Thyroid: No thyromegaly.     Vascular: No JVD.  Cardiovascular:     Rate and Rhythm: Normal rate and regular rhythm.     Heart sounds: Murmur present. No friction rub. No gallop.      Comments: Soft systolic murmur Pulmonary:      Effort: Pulmonary effort is normal. No respiratory distress.     Breath sounds: Normal breath sounds. No wheezing or rales.  Abdominal:     General: Bowel sounds are normal. There is no distension.     Palpations: Abdomen is soft. There is no mass.     Tenderness: There is no abdominal tenderness.     Comments: The abdomen is very soft, it is completely nontender  Musculoskeletal: Normal range of motion.        General: No tenderness.  Lymphadenopathy:     Cervical: No cervical adenopathy.  Skin:    General: Skin is warm and dry.     Coloration: Skin is pale.     Findings: No erythema or rash.  Neurological:     Mental Status: He is alert.     Coordination: Coordination normal.     Comments: The patient is awake and alert and able to follow all of my commands without any difficulty  Psychiatric:        Behavior: Behavior normal.      ED Treatments / Results  Labs (all labs ordered are listed, but only abnormal results are displayed) Labs Reviewed  CBG MONITORING, ED - Abnormal; Notable for the following components:      Result Value   Glucose-Capillary 350 (*)    All other components within normal limits  BASIC METABOLIC PANEL  CBC  URINALYSIS, ROUTINE W REFLEX MICROSCOPIC  POC OCCULT BLOOD, ED  TYPE AND SCREEN    EKG EKG Interpretation  Date/Time:  Monday Feb 17 2019 09:41:00 EDT Ventricular Rate:  100 PR Interval:    QRS Duration: 88 QT Interval:  375 QTC Calculation: 484 R Axis:   54 Text Interpretation:  Sinus tachycardia Consider RVH or posterior infarct Borderline prolonged QT interval Confirmed by Eber Hong ([id number]) on 02/17/2019 9:57:48 AM   Radiology No results found.  Procedures .Critical Care Performed by: Eber Hong, MD Authorized by: Eber Hong, MD   Critical care provider statement:    Critical care time (minutes):  35   Critical care time was exclusive of:  Separately billable procedures and treating other patients and teaching  time   Critical care was necessary to treat or prevent imminent or life-threatening deterioration of the following conditions: active GI bleeding, severe anemia.   Critical care was time spent personally by me on the following activities:  Blood draw for specimens, development of treatment plan with  patient or surrogate, discussions with consultants, evaluation of patient's response to treatment, examination of patient, obtaining history from patient or surrogate, ordering and performing treatments and interventions, ordering and review of laboratory studies, ordering and review of radiographic studies, pulse oximetry, re-evaluation of patient's condition and review of old charts   (including critical care time)  Medications Ordered in ED Medications  sodium chloride flush (NS) 0.9 % injection 3 mL (3 mLs Intravenous Not Given 02/17/19 0946)     Initial Impression / Assessment and Plan / ED Course  I have reviewed the triage vital signs and the nursing notes.  Pertinent labs & imaging results that were available during my care of the patient were reviewed by me and considered in my medical decision making (see chart for details).  Clinical Course as of Feb 16 1130  Mon Feb 17, 2019  1124 The patient's occult Hemoccult test was positive.  He did have some very formed tarry stool which was Hemoccult positive.  His hemoglobin is down to 4.7, he reports that it was last around 7 when it was checked back in December.  Metabolic panel pending.  The patient is critically ill with severe anemia and will need a type and screen and transfusion.  Will admit to the hospital.   [BM]    Clinical Course User Index [BM] Eber HongMiller, Geneve Kimpel, MD       The patient has no signs of an acute coronary syndrome clinically, he is not having any active chest pain and his EKG reveals no signs of ST elevations or depressions.  Unfortunately he does have a history of using excessive amounts of aspirin which I suspect is led  to peptic ulcer disease.  Will need to have a rectal exam to evaluate for the presence of blood though I suspect he is having GI bleeding especially given the vomiting of coffee-ground emesis.  He will get Pepcid, Protonix, he will likely need to be admitted to the hospital, type and screen, he does have some orthostatic changes including becoming hypotensive and tachycardic when he stands.  He had been anemic in the past, the evaluation of that anemia was only with stool cards that he did at home.  He has not had an endoscopy and he has never seen a cardiologist.  Will discuss with gastroenterology given that the hemoglobin is 4.7 -0 Dr. Jena Gaussourk to consult  Will discuss with hospitalist for admission  Transfusion ordered for the emergency department.  Final Clinical Impressions(s) / ED Diagnoses   Final diagnoses:  Severe anemia  Upper GI bleed      Eber HongMiller, Sae Handrich, MD 02/18/19 1419

## 2019-02-18 ENCOUNTER — Encounter (HOSPITAL_COMMUNITY)
Admission: EM | Disposition: A | Discharge status: Home / Self Care | Payer: Self-pay | Source: Home / Self Care | Attending: Internal Medicine

## 2019-02-18 DIAGNOSIS — D62 Acute posthemorrhagic anemia: Secondary | ICD-10-CM

## 2019-02-18 DIAGNOSIS — K259 Gastric ulcer, unspecified as acute or chronic, without hemorrhage or perforation: Secondary | ICD-10-CM

## 2019-02-18 DIAGNOSIS — K922 Gastrointestinal hemorrhage, unspecified: Secondary | ICD-10-CM

## 2019-02-18 DIAGNOSIS — K449 Diaphragmatic hernia without obstruction or gangrene: Secondary | ICD-10-CM

## 2019-02-18 DIAGNOSIS — R072 Precordial pain: Secondary | ICD-10-CM

## 2019-02-18 HISTORY — PX: ESOPHAGOGASTRODUODENOSCOPY: SHX5428

## 2019-02-18 HISTORY — PX: BIOPSY: SHX5522

## 2019-02-18 LAB — BASIC METABOLIC PANEL
Anion gap: 8 (ref 5–15)
BUN: 10 mg/dL (ref 8–23)
CO2: 23 mmol/L (ref 22–32)
Calcium: 8 mg/dL — ABNORMAL LOW (ref 8.9–10.3)
Chloride: 109 mmol/L (ref 98–111)
Creatinine, Ser: 0.66 mg/dL (ref 0.61–1.24)
GFR calc Af Amer: >60 mL/min (ref >60)
GFR calc non Af Amer: >60 mL/min (ref >60)
Glucose, Bld: 164 mg/dL — ABNORMAL HIGH (ref 70–99)
Potassium: 3.6 mmol/L (ref 3.5–5.1)
Sodium: 140 mmol/L (ref 135–145)

## 2019-02-18 LAB — CBC
HCT: 18.9 % — ABNORMAL LOW (ref 39.0–52.0)
Hemoglobin: 5.7 g/dL — CL (ref 13.0–17.0)
MCH: 29.4 pg (ref 26.0–34.0)
MCHC: 30.2 g/dL (ref 30.0–36.0)
MCV: 97.4 fL (ref 80.0–100.0)
Platelets: 187 10*3/uL (ref 150–400)
RBC: 1.94 MIL/uL — ABNORMAL LOW (ref 4.22–5.81)
RDW: 21.9 % — ABNORMAL HIGH (ref 11.5–15.5)
WBC: 5.3 10*3/uL (ref 4.0–10.5)
nRBC: 0 % (ref 0.0–0.2)

## 2019-02-18 LAB — PREPARE RBC (CROSSMATCH)

## 2019-02-18 LAB — GLUCOSE, CAPILLARY
Glucose-Capillary: 199 mg/dL — ABNORMAL HIGH (ref 70–99)
Glucose-Capillary: 149 mg/dL — ABNORMAL HIGH (ref 70–99)
Glucose-Capillary: 154 mg/dL — ABNORMAL HIGH (ref 70–99)
Glucose-Capillary: 183 mg/dL — ABNORMAL HIGH (ref 70–99)
Glucose-Capillary: 116 mg/dL — ABNORMAL HIGH (ref 70–99)

## 2019-02-18 LAB — HIV ANTIBODY (ROUTINE TESTING W REFLEX): HIV Screen 4th Generation wRfx: NONREACTIVE

## 2019-02-18 LAB — HEMOGLOBIN AND HEMATOCRIT, BLOOD
HCT: 25.1 % — ABNORMAL LOW (ref 39.0–52.0)
Hemoglobin: 7.6 g/dL — ABNORMAL LOW (ref 13.0–17.0)

## 2019-02-18 LAB — MRSA PCR SCREENING: MRSA by PCR: NEGATIVE

## 2019-02-18 SURGERY — EGD (ESOPHAGOGASTRODUODENOSCOPY)
Anesthesia: Moderate Sedation

## 2019-02-18 MED ORDER — SODIUM CHLORIDE 0.9 % IV SOLN
8.0000 mg/h | INTRAVENOUS | Status: AC
  Administered 2019-02-18 – 2019-02-21 (×6): 8 mg/h via INTRAVENOUS
  Filled 2019-02-18 (×9): qty 80

## 2019-02-18 MED ORDER — EPINEPHRINE 1 MG/10ML IJ SOSY
PREFILLED_SYRINGE | INTRAMUSCULAR | Status: AC
  Filled 2019-02-18: qty 20

## 2019-02-18 MED ORDER — ONDANSETRON HCL 4 MG/2ML IJ SOLN
INTRAMUSCULAR | Status: DC | PRN
  Administered 2019-02-18: 4 mg via INTRAVENOUS

## 2019-02-18 MED ORDER — SODIUM CHLORIDE 0.9% IV SOLUTION: Freq: Once | INTRAVENOUS | Status: DC

## 2019-02-18 MED ORDER — PANTOPRAZOLE SODIUM 40 MG PO TBEC
40.0000 mg | DELAYED_RELEASE_TABLET | Freq: Two times a day (BID) | ORAL | Status: DC
  Filled 2019-02-18: qty 1

## 2019-02-18 MED ORDER — LIDOCAINE VISCOUS HCL 2 % MT SOLN
OROMUCOSAL | Status: AC
  Filled 2019-02-18: qty 15

## 2019-02-18 MED ORDER — PANTOPRAZOLE SODIUM 40 MG IV SOLR
40.0000 mg | INTRAVENOUS | Status: DC
  Filled 2019-02-18: qty 40

## 2019-02-18 MED ORDER — ONDANSETRON HCL 4 MG/2ML IJ SOLN
INTRAMUSCULAR | Status: AC
  Filled 2019-02-18: qty 2

## 2019-02-18 MED ORDER — MIDAZOLAM HCL 5 MG/5ML IJ SOLN
INTRAMUSCULAR | Status: DC | PRN
  Administered 2019-02-18: 1 mg via INTRAVENOUS
  Administered 2019-02-18: 2 mg via INTRAVENOUS
  Administered 2019-02-18 (×2): 1 mg via INTRAVENOUS
  Administered 2019-02-18: 2 mg via INTRAVENOUS

## 2019-02-18 MED ORDER — MIDAZOLAM HCL 5 MG/5ML IJ SOLN
INTRAMUSCULAR | Status: AC
  Filled 2019-02-18: qty 10

## 2019-02-18 MED ORDER — STERILE WATER FOR IRRIGATION IR SOLN
Status: DC | PRN
  Administered 2019-02-18: 13:00:00

## 2019-02-18 MED ORDER — LIDOCAINE VISCOUS HCL 2 % MT SOLN
OROMUCOSAL | Status: DC | PRN
  Administered 2019-02-18: 4 mL via OROMUCOSAL

## 2019-02-18 MED ORDER — CITALOPRAM HYDROBROMIDE 20 MG PO TABS
40.0000 mg | ORAL_TABLET | Freq: Every day | ORAL | Status: DC
  Administered 2019-02-18 – 2019-02-21 (×4): 40 mg via ORAL
  Filled 2019-02-18 (×5): qty 2

## 2019-02-18 MED ORDER — MEPERIDINE HCL 50 MG/ML IJ SOLN
INTRAMUSCULAR | Status: AC
  Filled 2019-02-18: qty 1

## 2019-02-18 MED ORDER — SODIUM CHLORIDE (PF) 0.9 % IJ SOLN
PREFILLED_SYRINGE | INTRAMUSCULAR | Status: DC | PRN
  Administered 2019-02-18 (×2): 4 mL
  Administered 2019-02-18: 13:00:00 2 mL
  Administered 2019-02-18: 13:00:00 3 mL
  Administered 2019-02-18: 13:00:00 2 mL

## 2019-02-18 MED ORDER — SODIUM CHLORIDE 0.9% IV SOLUTION
Freq: Once | INTRAVENOUS | Status: AC
  Administered 2019-02-18: 16:00:00 via INTRAVENOUS

## 2019-02-18 MED ORDER — SODIUM CHLORIDE 0.9 % IV SOLN
80.0000 mg | Freq: Once | INTRAVENOUS | Status: DC
  Filled 2019-02-18: qty 80

## 2019-02-18 MED ORDER — MEPERIDINE HCL 100 MG/ML IJ SOLN
INTRAMUSCULAR | Status: DC | PRN
  Administered 2019-02-18: 15 mg via INTRAVENOUS
  Administered 2019-02-18: 25 mg via INTRAVENOUS
  Administered 2019-02-18: 10 mg via INTRAVENOUS

## 2019-02-18 NOTE — Op Note (Signed)
[facility] Patient Name: Joel Roberson Procedure Date: 02/18/2019 10:53 AM MRN: [id number] Date of Birth: 06-09-1956 Attending MD: Gennette Pac , MD CSN: [id number] Age: 63 Admit Type: Inpatient Procedure:                Upper GI endoscopy Indications:              Melena Providers:                Gennette Pac, MD, Criselda Peaches. Patsy Lager, RN,                            Buel Ream. Thomasena Edis RN, RN Referring MD:              Medicines:                Midazolam 7 mg IV, Meperidine 50 mg IV, Ondansetron                            4 mg IV Complications:            No immediate complications. Estimated Blood Loss:     Estimated blood loss: 100 mL Procedure:                Pre-Anesthesia Assessment:                           - Prior to the procedure, a History and Physical                            was performed, and patient medications and                            allergies were reviewed. The patient's tolerance of                            previous anesthesia was also reviewed. The risks                            and benefits of the procedure and the sedation                            options and risks were discussed with the patient.                            All questions were answered, and informed consent                            was obtained. Prior Anticoagulants: The patient has                            taken no previous anticoagulant or antiplatelet                            agents. ASA Grade Assessment: II - A patient with  mild systemic disease. After reviewing the risks                            and benefits, the patient was deemed in                            satisfactory condition to undergo the procedure.                           After obtaining informed consent, the endoscope was                            passed under direct vision. Throughout the                            procedure, the patient's blood pressure,  pulse, and                            oxygen saturations were monitored continuously. The                            GIF-H190 ([id number] scope was introduced through the                            mouth, and advanced to the second part of duodenum.                            The upper GI endoscopy was accomplished without                            difficulty. The patient tolerated the procedure                            well. Scope In: 12:49:45 PM Scope Out: 1:14:16 PM Total Procedure Duration: 0 hours 24 minutes 31 seconds  Findings:      The examined esophagus was normal.      A small hiatal hernia was present. (2) 8 mm elliptical prepyloric antral       ulcers. Both had clean bases. Both appeared to be benign lesions. Patent       pylorus. In the bulb there was a LARGE area of excavated, cratered       mucosa semi-lunar in distribution straddling the posterior bulb and the       second portion. Denuded, raw appearing submucosa. No bleeding initially.       As I brushed by it with the scope to examine the second portion (normal       D2 otherwise, brisk arterial bleeding ensued emanating from the center       of the crater. A total of 15 cc of 1-10,000 epinephrine was injected in       and around the ulcer crater with good hemostasis being achieved. It was       difficult to see a discrete visible vessel. In the location of the       suspected vessel, I did attempt to place a 360 clip, however, the tissue  here was tacked down and firm; no pliability to grasp the tissue. No       discrete lesion to treat with a gold probe. Again, hemostasis achieved       after epinephrine injected. Impression:               - Normal esophagus.                           - Small hiatal hernia.                           Gastric ulcers?"appeared benign without bleeding                            stigmata. Large area of duodenal ulceration                            somewhat suspicious -cannot  exclude underlying                            malignancy at this point.. Status post bleeding                            control therapy. Certainly duodenal ulcer, out of                            proportion to gastric mucosal findings. Duodenal                            lesion at high risk of rebleeding. I have discussed                            with Dr. Arbutus Leas. I discussed with Dr. Larae Grooms.                            If rebleeds, may not be able to provide definitive                            therapy endoscopically. Could potentially need                            surgery versus IR services. As discussed with Dr.                            Henreitta Leber, for now will continue blood resuscitation                            and provide 2 units of FFP. PPI infusion x72 hours.                            ICU admission. Keep 2 units typed and crossed. H&H                            following second unit infused remains pending.  At a                            very minimum, will need a repeat EGD in 12 weeks.                            Follow-up on gastric biopsies. KEEP NPO. Moderate Sedation:      Moderate (conscious) sedation was administered by the endoscopy nurse       and supervised by the endoscopist. The following parameters were       monitored: oxygen saturation, heart rate, blood pressure, respiratory       rate, EKG, adequacy of pulmonary ventilation, and response to care. Recommendation:           See above. Procedure Code(s):        --- Professional ---                           [phone], Esophagogastroduodenoscopy, flexible,                            transoral; diagnostic, including collection of                            specimen(s) by brushing or washing, when performed                            (separate procedure) Diagnosis Code(s):        --- Professional ---                           K44.9, Diaphragmatic hernia without obstruction or                             gangrene                           K92.1, Melena (includes Hematochezia) CPT copyright 2019 [employer]. All rights reserved. The codes documented in this report are preliminary and upon coder review may  be revised to meet current compliance requirements. Gerrit Friends. Kadance Mccuistion, MD Gennette Pac, MD 02/18/2019 2:02:36 PM This report has been signed electronically. Number of Addenda: 0

## 2019-02-18 NOTE — Progress Notes (Signed)
    Subjective: No overt GI bleeding. Feels much improved after 1 unit PRBCs. No abdominal pain, N/V. No SOB.   Objective: Vital signs in last 24 hours: Temp:  [98 F (36.7 C)-98.4 F (36.9 C)] 98 F (36.7 C) (05/12 0533) Pulse Rate:  [68-107] 68 (05/12 0533) Resp:  [8-21] 18 (05/12 0533) BP: (104-137)/(51-74) 130/64 (05/12 0533) SpO2:  [97 %-100 %] 98 % (05/12 0808) Weight:  [79.5 kg-81.6 kg] 79.5 kg (05/11 1506) Last BM Date: 02/15/19 General:   Alert and oriented, pleasant Abdomen:  Bowel sounds present, soft, non-tender, non-distended. No HSM or hernias noted. No rebound or guarding. No masses appreciated. Abdominal bruit left of umbilicus Msk:  Symmetrical without gross deformities. Normal posture. Extremities:  Without edema. Neurologic:  Alert and  oriented x4  Intake/Output from previous day: 05/11 0701 - 05/12 0700 In: 2099.1 [P.O.:480; I.V.:525.8; IV Piggyback:1093.3] Out: 1600 [Urine:1600] Intake/Output this shift: No intake/output data recorded.  Lab Results: Recent Labs    02/17/19 1051 02/18/19 0540  WBC 6.9 5.3  HGB 4.7* 5.7*  HCT 16.4* 18.9*  PLT 205 187   BMET Recent Labs    02/17/19 1051 02/18/19 0540  NA 135 140  K 3.9 3.6  CL 105 109  CO2 21* 23  GLUCOSE 337* 164*  BUN 16 10  CREATININE 0.70 0.66  CALCIUM 8.0* 8.0*   LFT Recent Labs    02/17/19 1051  PROT 5.0*  ALBUMIN 2.8*  AST 12*  ALT 10  ALKPHOS 55  BILITOT 0.2*   PT/INR Recent Labs    02/17/19 1051  LABPROT 14.4  INR 1.1    Assessment: 63 year old male admitted with profound anemia, melena, history of Goody powders chronically. Hgb now 5.7 after 1 unit PRBCs as expected; clinically he is hemodynamically stable and symptomatically improved from admission. No overt GI bleeding.   Will continue with plans for EGD with Dr. Jena Gauss today at 1230. Needs additional unit PRBCs that has been ordered this morning completed prior to EGD. Discussed with nursing staff. Risks and  benefits discussed with patient again with stated understanding.    Plan: Remain NPO PPI IV BID EGD with Dr. Jena Gauss today Complete additional unit PRBCs ordered today prior to EGD: discussed with nursing staff Avoidance of NSAIDs going forward   Gelene Mink, PhD, ANP-BC [facility]    LOS: 1 day    02/18/2019, 8:37 AM

## 2019-02-18 NOTE — Progress Notes (Signed)
PROGRESS NOTE  Joel BaumgartenWalter G Roberson OZD:[id number] DOB: December 11, 1955 DOA: 02/17/2019 PCP: Joel Roberson, No Pcp Per  Brief History:   63 y.o. male with medical history of diabetes mellitus type 2, hypertension, hyperlipidemia, depression, iron deficiency presenting with generalized weakness and dizziness that has gradually worsened over the past 1-1/2 weeks.  The Joel Roberson states that he has been taking Goody powders on a daily basis for a number of years.  In the past 1 to 2 weeks he has noted melanotic stools.  The Joel Roberson had an episode of hematemesis and coffee-ground emesis on 02/15/2019.  In addition, the Joel Roberson has had intermittent hematochezia for the past 1-1/2 weeks with the last episode on 02/14/2019.  He is complaining of worsening generalized weakness and lightheadedness.  He has had intermittent chest discomfort at rest as well as with exertion.   In the emergency department, the Joel Roberson was afebrile hemodynamically stable saturating 100% room air.  BMP and LFTs were unremarkable.  Hemoglobin was 4.7.  GI was consulted to assist with management.  EGD was performed on 02/18/19.  Assessment/Plan: Acute blood loss anemia -Secondary GI bleed -Suspect NSAID induced peptic ulcer disease -Transfused 2 unit PRBC during the admisson -am CBC  Hematochezia and melena/hematemesis -GI consult appreciated -EGD 02/18/2019--large duodenal ulcer with brisk bleed treated with EPI.  Cannot r/o malignancy -Start Protonix drip x 72 hours (started 02/18/19 pm) -NPO -Discontinue all NSAIDs -general surgery consulted -case discussed with Dr. Jena Gaussourk  Chest pain -Sounds mostly atypical -Cycle troponins--neg x 3 -Echocardiogram--EF 60-65%, mild AS -personally reviewed EKG--sinus, no STT changes  Diabetes mellitus type 2 -Hemoglobin A1c--6.2 -Holding metformin -discontinue NovoLog sliding scale and CBGs  Essential hypertension -Restart lisinopril  Hyperlipidemia -Restart statin   Anxiety/depression -Restart home dose Celexa and Valium    Disposition Plan:   Home in 3 days  Family Communication:   No Family at bedside  Consultants:  [facility]  Code Status:  FULL   DVT Prophylaxis:  SCDs   Procedures: As Listed in Progress Note Above  Antibiotics: None       Subjective: Joel Roberson denies fevers, chills, headache, chest pain, dyspnea, nausea, vomiting, diarrhea, abdominal pain, dysuria, hematuria, hematochezia, and melena.   Objective: Vitals:   02/18/19 1501 02/18/19 1600 02/18/19 1601 02/18/19 1620  BP:  129/70  134/75  Pulse:  78  87  Resp: 10 (!) 9 12 15   Temp:  98.7 F (37.1 C)  98.4 F (36.9 C)  TempSrc:  Oral  Oral  SpO2:  100%  99%  Weight:      Height:        Intake/Output Summary (Last 24 hours) at 02/18/2019 1733 Last data filed at 02/18/2019 1655 Gross per 24 hour  Intake 1757.92 ml  Output 1750 ml  Net 7.92 ml   Weight change:  Exam:   General:  Pt is alert, follows commands appropriately, not in acute distress  HEENT: No icterus, No thrush, No neck mass, [place]/AT  Cardiovascular: RRR, S1/S2, no rubs, no gallops  Respiratory: CTA bilaterally, no wheezing, no crackles, no rhonchi  Abdomen: Soft/+BS, non tender, non distended, no guarding  Extremities: No edema, No lymphangitis, No petechiae, No rashes, no synovitis   Data Reviewed: I have personally reviewed following labs and imaging studies Basic Metabolic Panel: Recent Labs  Lab 02/17/19 1051 02/18/19 0540  NA 135 140  K 3.9 3.6  CL 105 109  CO2 21* 23  GLUCOSE 337* 164*  BUN  16 10  CREATININE 0.70 0.66  CALCIUM 8.0* 8.0*   Liver Function Tests: Recent Labs  Lab 02/17/19 1051  AST 12*  ALT 10  ALKPHOS 55  BILITOT 0.2*  PROT 5.0*  ALBUMIN 2.8*   No results for input(s): LIPASE, AMYLASE in the last 168 hours. No results for input(s): AMMONIA in the last 168 hours. Coagulation Profile: Recent Labs  Lab 02/17/19 1051  INR 1.1   CBC:  Recent Labs  Lab 02/17/19 1051 02/18/19 0540 02/18/19 1314  WBC 6.9 5.3  --   NEUTROABS 5.4  --   --   HGB 4.7* 5.7* 7.6*  HCT 16.4* 18.9* 25.1*  MCV 102.5* 97.4  --   PLT 205 187  --    Cardiac Enzymes: Recent Labs  Lab 02/17/19 1051 02/17/19 1655 02/17/19 2056  TROPONINI <0.03 <0.03 <0.03   BNP: Invalid input(s): POCBNP CBG: Recent Labs  Lab 02/17/19 1629 02/17/19 2132 02/18/19 0733 02/18/19 1202 02/18/19 1651  GLUCAP 128* 199* 149* 154* 183*   HbA1C: Recent Labs    02/17/19 1655  HGBA1C 6.2*   Urine analysis:    Component Value Date/Time   COLORURINE YELLOW 02/17/2019 0945   APPEARANCEUR HAZY (A) 02/17/2019 0945   LABSPEC 1.015 02/17/2019 0945   PHURINE 5.0 02/17/2019 0945   GLUCOSEU >=500 (A) 02/17/2019 0945   HGBUR NEGATIVE 02/17/2019 0945   BILIRUBINUR NEGATIVE 02/17/2019 0945   KETONESUR NEGATIVE 02/17/2019 0945   PROTEINUR NEGATIVE 02/17/2019 0945   NITRITE NEGATIVE 02/17/2019 0945   LEUKOCYTESUR TRACE (A) 02/17/2019 0945   Sepsis Labs: (procalcitonin:4,lacticidven:4) ) Recent Results (from the past 240 hour(s))  SARS Coronavirus 2 (CEPHEID- Performed in [facility] hospital lab), Hosp Order     Status: None   Collection Time: 02/17/19 12:34 PM  Result Value Ref Range Status   SARS Coronavirus 2 NEGATIVE NEGATIVE Final    Comment: (NOTE) If result is NEGATIVE SARS-CoV-2 target nucleic acids are NOT DETECTED. The SARS-CoV-2 RNA is generally detectable in upper and lower  respiratory specimens during the acute phase of infection. The lowest  concentration of SARS-CoV-2 viral copies this assay can detect is 250  copies / mL. A negative result does not preclude SARS-CoV-2 infection  and should not be used as the sole basis for treatment or other  Joel Roberson management decisions.  A negative result may occur with  improper specimen collection / handling, submission of specimen other  than nasopharyngeal swab, presence of viral  mutation(s) within the  areas targeted by this assay, and inadequate number of viral copies  (<250 copies / mL). A negative result must be combined with clinical  observations, Joel Roberson history, and epidemiological information. If result is POSITIVE SARS-CoV-2 target nucleic acids are DETECTED. The SARS-CoV-2 RNA is generally detectable in upper and lower  respiratory specimens dur ing the acute phase of infection.  Positive  results are indicative of active infection with SARS-CoV-2.  Clinical  correlation with Joel Roberson history and other diagnostic information is  necessary to determine Joel Roberson infection status.  Positive results do  not rule out bacterial infection or co-infection with other viruses. If result is PRESUMPTIVE POSTIVE SARS-CoV-2 nucleic acids MAY BE PRESENT.   A presumptive positive result was obtained on the submitted specimen  and confirmed on repeat testing.  While 2019 novel coronavirus  (SARS-CoV-2) nucleic acids may be present in the submitted sample  additional confirmatory testing may be necessary for epidemiological  and / or clinical management purposes  to differentiate between  SARS-CoV-2  and other Sarbecovirus currently known to infect humans.  If clinically indicated additional testing with an alternate test  methodology [phone]) is advised. The SARS-CoV-2 RNA is generally  detectable in upper and lower respiratory sp ecimens during the acute  phase of infection. The expected result is Negative. Fact Sheet for Patients:  BoilerBrush.com.cy Fact Sheet for Healthcare Providers: https://pope.com/ This test is not yet approved or cleared by the [place] FDA and has been authorized for detection and/or diagnosis of SARS-CoV-2 by FDA under an Emergency Use Authorization (EUA).  This EUA will remain in effect (meaning this test can be used) for the duration of the COVID-19 declaration under Section 564(b)(1)  of the Act, 21 U.S.C. section 360bbb-3(b)(1), unless the authorization is terminated or revoked sooner. Performed at [facility], [address] [place], [place] [address]      Scheduled Meds: . sodium chloride   Intravenous Once  . atorvastatin  20 mg Oral Daily  . citalopram  40 mg Oral Daily  . diazepam  10 mg Oral QHS  . EPINEPHrine      . insulin aspart  0-9 Units Subcutaneous TID WC  . lidocaine      . meperidine      . midazolam      . ondansetron      . [START ON 02/22/2019] pantoprazole  40 mg Oral BID AC  . sodium chloride flush  3 mL Intravenous Once  . tamsulosin  0.4 mg Oral Daily   Continuous Infusions: . lactated ringers 50 mL/hr at 02/18/19 1629  . pantoprozole (PROTONIX) infusion 8 mg/hr (02/18/19 1629)    Procedures/Studies: No results found.  Catarina Hartshorn, DO  [facility] Pager [phone]  If 7PM-7AM, please contact night-coverage [email or web address] Password [facility] 02/18/2019, 5:33 PM   LOS: 1 day

## 2019-02-18 NOTE — Progress Notes (Signed)
CRITICAL VALUE ALERT  Critical Value:  5.7 hemoglobin  Date & Time Notied:  02/18/19 6945  Provider Notified: t opyd  Orders Received/Actions taken: awaiting orders

## 2019-02-19 ENCOUNTER — Encounter (HOSPITAL_COMMUNITY): Payer: Self-pay | Admitting: Internal Medicine

## 2019-02-19 DIAGNOSIS — F329 Major depressive disorder, single episode, unspecified: Secondary | ICD-10-CM

## 2019-02-19 DIAGNOSIS — K264 Chronic or unspecified duodenal ulcer with hemorrhage: Principal | ICD-10-CM

## 2019-02-19 LAB — CBC
HCT: 23.9 % — ABNORMAL LOW (ref 39.0–52.0)
Hemoglobin: 7.3 g/dL — ABNORMAL LOW (ref 13.0–17.0)
MCH: 29 pg (ref 26.0–34.0)
MCHC: 30.5 g/dL (ref 30.0–36.0)
MCV: 94.8 fL (ref 80.0–100.0)
Platelets: 176 10*3/uL (ref 150–400)
RBC: 2.52 MIL/uL — ABNORMAL LOW (ref 4.22–5.81)
RDW: 19.9 % — ABNORMAL HIGH (ref 11.5–15.5)
WBC: 6 10*3/uL (ref 4.0–10.5)
nRBC: 0.3 % — ABNORMAL HIGH (ref 0.0–0.2)

## 2019-02-19 LAB — BPAM FFP
Blood Product Expiration Date: 202005131756
Blood Product Expiration Date: 202005172359
ISSUE DATE / TIME: 202005121555
ISSUE DATE / TIME: 202005121811
Unit Type and Rh: 8400
Unit Type and Rh: 8400

## 2019-02-19 LAB — PREPARE FRESH FROZEN PLASMA

## 2019-02-19 LAB — GLUCOSE, CAPILLARY
Glucose-Capillary: 127 mg/dL — ABNORMAL HIGH (ref 70–99)
Glucose-Capillary: 125 mg/dL — ABNORMAL HIGH (ref 70–99)
Glucose-Capillary: 154 mg/dL — ABNORMAL HIGH (ref 70–99)
Glucose-Capillary: 218 mg/dL — ABNORMAL HIGH (ref 70–99)

## 2019-02-19 LAB — BASIC METABOLIC PANEL
Anion gap: 7 (ref 5–15)
BUN: 8 mg/dL (ref 8–23)
CO2: 25 mmol/L (ref 22–32)
Calcium: 7.7 mg/dL — ABNORMAL LOW (ref 8.9–10.3)
Chloride: 108 mmol/L (ref 98–111)
Creatinine, Ser: 0.74 mg/dL (ref 0.61–1.24)
GFR calc Af Amer: >60 mL/min (ref >60)
GFR calc non Af Amer: >60 mL/min (ref >60)
Glucose, Bld: 117 mg/dL — ABNORMAL HIGH (ref 70–99)
Potassium: 3.5 mmol/L (ref 3.5–5.1)
Sodium: 140 mmol/L (ref 135–145)

## 2019-02-19 LAB — HEMOGLOBIN AND HEMATOCRIT, BLOOD
HCT: 23.4 % — ABNORMAL LOW (ref 39.0–52.0)
Hemoglobin: 7.3 g/dL — ABNORMAL LOW (ref 13.0–17.0)

## 2019-02-19 MED ORDER — INSULIN ASPART 100 UNIT/ML ~~LOC~~ SOLN
0.0000 [IU] | Freq: Every day | SUBCUTANEOUS | Status: DC
  Administered 2019-02-19: 2 [IU] via SUBCUTANEOUS

## 2019-02-19 MED ORDER — BISACODYL 10 MG RE SUPP
10.0000 mg | Freq: Every day | RECTAL | Status: DC | PRN
  Administered 2019-02-19: 10 mg via RECTAL
  Filled 2019-02-19: qty 1

## 2019-02-19 MED ORDER — INSULIN ASPART 100 UNIT/ML ~~LOC~~ SOLN
0.0000 [IU] | Freq: Three times a day (TID) | SUBCUTANEOUS | Status: DC
  Administered 2019-02-20: 2 [IU] via SUBCUTANEOUS
  Administered 2019-02-20: 5 [IU] via SUBCUTANEOUS
  Administered 2019-02-21: 2 [IU] via SUBCUTANEOUS
  Administered 2019-02-21: 3 [IU] via SUBCUTANEOUS

## 2019-02-19 MED ORDER — INSULIN ASPART 100 UNIT/ML ~~LOC~~ SOLN
0.0000 [IU] | Freq: Three times a day (TID) | SUBCUTANEOUS | Status: DC
  Administered 2019-02-19: 1 [IU] via SUBCUTANEOUS
  Administered 2019-02-19: 2 [IU] via SUBCUTANEOUS

## 2019-02-19 NOTE — Progress Notes (Signed)
[facility]  A curbside discussion with Dr. Jena Gauss occurred yesterday.  He gave me an FYI about the patient but Dr. Don Perking note from yesterday indicates he thought an official surgery consult was placed. No order for a consult placed.   Dr. Jena Gauss had performed an EGD for melena and noted peptic ulcer disease thought to be related to Goodies powder, and significant ulceration and disease in the duodenum at the bulb/ just past the bulb. Epinephrine placed into the area and bleeding stopped, no clip could be applied.   Yesterday during our conversation we discussed blood transfusion with 2U pRBC and I also recommended FFP for a 1:1 resuscitation.  The patient was sent to the ICU.  I also told Dr. Jena Gauss that IR and embolization would be the next step if the patient continued to bleed.  He was worried that he could bleed and become unstable and wanted me to know about the patient pending any need for emergency surgery if the patient could not be transported.  Dr. Jena Gauss did mention that he could attempt another endoscopy also.    Overnight no documented BMs. H&H at 1:30AM was stable at 7.3/23.9 from 7.6/25.1 the day prior.  Would continue to check H&H serially, and if he drops H&H he would need to be transferred so that he can get IR intervention.     Will discuss with Dr. Gwenlyn Perking and Dr. Jena Gauss. Will confirm if an official consult is being requested.  Algis Greenhouse, MD [facility] [address] [place], [place] [address] [phone] (office)

## 2019-02-19 NOTE — Progress Notes (Signed)
PROGRESS NOTE  Joel BaumgartenWalter G Gola [id number] DOB: 27-Dec-1955 DOA: 02/17/2019 PCP: Patient, No Pcp Per  Brief History:   63 y.o. male with medical history of diabetes mellitus type 2, hypertension, hyperlipidemia, depression, iron deficiency presenting with generalized weakness and dizziness that has gradually worsened over the past 1-1/2 weeks.  The patient states that he has been taking Goody powders on a daily basis for a number of years.  In the past 1 to 2 weeks he has noted melanotic stools.  The patient had an episode of hematemesis and coffee-ground emesis on 02/15/2019.  In addition, the patient has had intermittent hematochezia for the past 1-1/2 weeks with the last episode on 02/14/2019.  He is complaining of worsening generalized weakness and lightheadedness.  He has had intermittent chest discomfort at rest as well as with exertion.   In the emergency department, the patient was afebrile hemodynamically stable saturating 100% room air.  BMP and LFTs were unremarkable.  Hemoglobin was 4.7.  GI was consulted to assist with management.  EGD was performed on 02/18/19.  Assessment/Plan: Acute blood loss anemia -Secondary GI bleed -Suspect NSAID induced peptic ulcer disease -Transfused 2 unit PRBC during this admisson -Follow hemoglobin trend.  Hematochezia and melena/hematemesis -GI consult appreciated -EGD 02/18/2019--large duodenal ulcer with brisk bleed treated with EPI.  Cannot r/o malignancy -Continue Protonix drip -[place] diet as recommended by GI service. -Discontinue all NSAIDs and patient has been advised to avoid any of these medications at discharge. -case discussed with Dr. Jena Gaussourk  Chest pain -Sounds mostly atypical most likely associated with gastroesophageal reflux disease. -troponins--neg x 3 -Echocardiogram--EF 60-65%, mild AS -personally reviewed EKG--sinus, no ST segment or T wave changes -Discontinue telemetry.  Diabetes mellitus type 2 -Hemoglobin  A1c--6.2 -Continue holding metformin while inpatient. -Follow CBGs and use sliding scale insulin.  Essential hypertension -Stable and well-controlled -Continue lisinopril -Heart healthy diet has been instructed.  Hyperlipidemia -Continue statins  Anxiety/depression -Stable mood -No suicidal ideation or hallucination -Continue Celexa and Valium.     Disposition Plan:   Home in am if otherwise stable and tolerating diet.  Family Communication:   No Family at bedside  Consultants:  [facility]  Code Status:  FULL   DVT Prophylaxis:  SCDs   Procedures: EGD 02/18/2019: Demonstrating a millimeters elliptical prepyloric antral ulcer of benign appearance; there was also a large area of duodenal ulceration somewhat suspicious unable to exclude malignancy.  Patient received bleeding control therapy with epinephrine.  Unable to apply hemostasis clip.  Antibiotics: None  Subjective: Afebrile, in no acute distress.  No chest pain, no nausea, no vomiting, no further signs of overt bleeding.  Denies abdominal pain.  Complaining of constipation.   Objective: Vitals:   02/19/19 1400 02/19/19 1500 02/19/19 1600 02/19/19 1611  BP: 129/67 134/69 140/69   Pulse: 64 64 61 (!) 58  Resp: (!) 0 (!) 9 15 18   Temp:    98.1 F (36.7 C)  TempSrc:    Oral  SpO2: 98% 98% 98% 98%  Weight:      Height:        Intake/Output Summary (Last 24 hours) at 02/19/2019 1755 Last data filed at 02/19/2019 1611 Gross per 24 hour  Intake 2026.41 ml  Output 950 ml  Net 1076.41 ml   Weight change: -1.347 kg   Exam: General exam: Alert, awake, oriented x 3; denies chest pain, shortness of breath, nausea, vomiting and abdominal pain.  Patient reports  constipation otherwise no complaints. Respiratory system: Clear to auscultation. Respiratory effort normal. Cardiovascular system:RRR. No murmurs, rubs, gallops. Gastrointestinal system: Abdomen is nondistended, soft and nontender. No organomegaly or  masses felt. Normal bowel sounds heard. Central nervous system: Alert and oriented. No focal neurological deficits. Extremities: No C/C/E, +pedal pulses Skin: No rashes, lesions or ulcers Psychiatry: Judgement and insight appear normal. Mood & affect appropriate.   Data Reviewed: I have personally reviewed following labs and imaging studies Basic Metabolic Panel: Recent Labs  Lab 02/17/19 1051 02/18/19 0540 02/19/19 0130  NA 135 140 140  K 3.9 3.6 3.5  CL 105 109 108  CO2 21* 23 25  GLUCOSE 337* 164* 117*  BUN CREATININE 0.70 0.66 0.74  CALCIUM 8.0* 8.0* 7.7*   Liver Function Tests: Recent Labs  Lab 02/17/19 1051  AST 12*  ALT 10  ALKPHOS 55  BILITOT 0.2*  PROT 5.0*  ALBUMIN 2.8*   Coagulation Profile: Recent Labs  Lab 02/17/19 1051  INR 1.1   CBC: Recent Labs  Lab 02/17/19 1051 02/18/19 0540 02/18/19 1314 02/19/19 0130  WBC 6.9 5.3  --  6.0  NEUTROABS 5.4  --   --   --   HGB 4.7* 5.7* 7.6* 7.3*  7.3*  HCT 16.4* 18.9* 25.1* 23.9*  23.4*  MCV 102.5* 97.4  --  94.8  PLT 205 187  --  176   Cardiac Enzymes: Recent Labs  Lab 02/17/19 1051 02/17/19 1655 02/17/19 2056  TROPONINI <0.03 <0.03 <0.03   BNP: Invalid input(s): POCBNP CBG: Recent Labs  Lab 02/18/19 1651 02/18/19 2128 02/19/19 0745 02/19/19 1119 02/19/19 1608  GLUCAP 183* 116* 127* 125* 154*   HbA1C: Recent Labs    02/17/19 1655  HGBA1C 6.2*   Urine analysis:    Component Value Date/Time   COLORURINE YELLOW 02/17/2019 0945   APPEARANCEUR HAZY (A) 02/17/2019 0945   LABSPEC 1.015 02/17/2019 0945   PHURINE 5.0 02/17/2019 0945   GLUCOSEU >=500 (A) 02/17/2019 0945   HGBUR NEGATIVE 02/17/2019 0945   BILIRUBINUR NEGATIVE 02/17/2019 0945   KETONESUR NEGATIVE 02/17/2019 0945   PROTEINUR NEGATIVE 02/17/2019 0945   NITRITE NEGATIVE 02/17/2019 0945   LEUKOCYTESUR TRACE (A) 02/17/2019 0945    Recent Results (from the past 240 hour(s))  SARS Coronavirus 2 (CEPHEID-  Performed in [facility] hospital lab), Hosp Order     Status: None   Collection Time: 02/17/19 12:34 PM  Result Value Ref Range Status   SARS Coronavirus 2 NEGATIVE NEGATIVE Final    Comment: (NOTE) If result is NEGATIVE SARS-CoV-2 target nucleic acids are NOT DETECTED. The SARS-CoV-2 RNA is generally detectable in upper and lower  respiratory specimens during the acute phase of infection. The lowest  concentration of SARS-CoV-2 viral copies this assay can detect is 250  copies / mL. A negative result does not preclude SARS-CoV-2 infection  and should not be used as the sole basis for treatment or other  patient management decisions.  A negative result may occur with  improper specimen collection / handling, submission of specimen other  than nasopharyngeal swab, presence of viral mutation(s) within the  areas targeted by this assay, and inadequate number of viral copies  (<250 copies / mL). A negative result must be combined with clinical  observations, patient history, and epidemiological information. If result is POSITIVE SARS-CoV-2 target nucleic acids are DETECTED. The SARS-CoV-2 RNA is generally detectable in upper and lower  respiratory specimens dur ing the acute phase of infection.  Positive  results are indicative of active infection with SARS-CoV-2.  Clinical  correlation with patient history and other diagnostic information is  necessary to determine patient infection status.  Positive results do  not rule out bacterial infection or co-infection with other viruses. If result is PRESUMPTIVE POSTIVE SARS-CoV-2 nucleic acids MAY BE PRESENT.   A presumptive positive result was obtained on the submitted specimen  and confirmed on repeat testing.  While 2019 novel coronavirus  (SARS-CoV-2) nucleic acids may be present in the submitted sample  additional confirmatory testing may be necessary for epidemiological  and / or clinical management purposes  to differentiate between   SARS-CoV-2 and other Sarbecovirus currently known to infect humans.  If clinically indicated additional testing with an alternate test  methodology [phone]) is advised. The SARS-CoV-2 RNA is generally  detectable in upper and lower respiratory sp ecimens during the acute  phase of infection. The expected result is Negative. Fact Sheet for Patients:  BoilerBrush.com.cy Fact Sheet for Healthcare Providers: https://pope.com/ This test is not yet approved or cleared by the [place] FDA and has been authorized for detection and/or diagnosis of SARS-CoV-2 by FDA under an Emergency Use Authorization (EUA).  This EUA will remain in effect (meaning this test can be used) for the duration of the COVID-19 declaration under Section 564(b)(1) of the Act, 21 U.S.C. section 360bbb-3(b)(1), unless the authorization is terminated or revoked sooner. Performed at [facility], [address] [place], [place] [address]   MRSA PCR Screening     Status: None   Collection Time: 02/18/19  2:42 PM  Result Value Ref Range Status   MRSA by PCR NEGATIVE NEGATIVE Final    Comment:        The GeneXpert MRSA Assay (FDA approved for NASAL specimens only), is one component of a comprehensive MRSA colonization surveillance program. It is not intended to diagnose MRSA infection nor to guide or monitor treatment for MRSA infections. Performed at [facility], [address] [place], [place] [address]      Scheduled Meds: . sodium chloride   Intravenous Once  . atorvastatin  20 mg Oral Daily  . citalopram  40 mg Oral Daily  . diazepam  10 mg Oral QHS  . insulin aspart  0-9 Units Subcutaneous TID WC  . [START ON 02/22/2019] pantoprazole  40 mg Oral BID AC  . sodium chloride flush  3 mL Intravenous Once  . tamsulosin  0.4 mg Oral Daily   Continuous Infusions: . pantoprozole (PROTONIX) infusion 8 mg/hr (02/19/19 1535)    Procedures/Studies: No results  found.  Vassie Loll, MD  [facility] Pager [phone]   02/19/2019, 5:55 PM   LOS: 2 days

## 2019-02-19 NOTE — TOC Progression Note (Signed)
Reviewed pt's record today. Pt is low risk on the readmission scale. No immediate TOC needs identified. Will be available if needs arise.

## 2019-02-19 NOTE — Progress Notes (Signed)
Subjective:  No complaints. Wants to eat. Wonders what he will be able to take to manage chronic aches/pain since we are urging no NSAIDS/ASA.  Objective: Vital signs in last 24 hours: Temp:  [97.6 F (36.4 C)-98.7 F (37.1 C)] 98.1 F (36.7 C) (05/13 0747) Pulse Rate:  [66-146] 74 (05/13 0747) Resp:  [4-24] 13 (05/13 0747) BP: (114-181)/(55-83) 122/56 (05/13 0700) SpO2:  [95 %-100 %] 97 % (05/13 0747) Weight:  [80.3 kg] 80.3 kg (05/13 0500) Last BM Date: 02/15/19 General:   Alert,  Well-developed, well-nourished, pleasant and cooperative in NAD Head:  Normocephalic and atraumatic. Eyes:  Sclera clear, no icterus.  Abdomen:  Soft, nontender and nondistended. N Normal bowel sounds, without guarding, and without rebound.   Neurologic:  Alert and  oriented x4;  grossly normal neurologically. Psych:  Alert and cooperative. Normal mood and affect.  Intake/Output from previous day: 05/12 0701 - 05/13 0700 In: 2600.4 [I.V.:1745.8; Blood:854.7] Out: 1200 [Urine:1200] Intake/Output this shift: No intake/output data recorded.  Lab Results: CBC Recent Labs    02/17/19 1051 02/18/19 0540 02/18/19 1314 02/19/19 0130  WBC 6.9 5.3  --  6.0  HGB 4.7* 5.7* 7.6* 7.3*  7.3*  HCT 16.4* 18.9* 25.1* 23.9*  23.4*  MCV 102.5* 97.4  --  94.8  PLT 205 187  --  176   BMET Recent Labs    02/17/19 1051 02/18/19 0540 02/19/19 0130  NA 135 140 140  K 3.9 3.6 3.5  CL 105 109 108  CO2 21* 23 25  GLUCOSE 337* 164* 117*  BUN 16 10 8   CREATININE 0.70 0.66 0.74  CALCIUM 8.0* 8.0* 7.7*   LFTs Recent Labs    02/17/19 1051  BILITOT 0.2*  ALKPHOS 55  AST 12*  ALT 10  PROT 5.0*  ALBUMIN 2.8*   No results for input(s): LIPASE in the last 72 hours. PT/INR Recent Labs    02/17/19 1051  LABPROT 14.4  INR 1.1      Imaging Studies: No results found.[2 weeks]   Assessment: 63 year old gentleman admitted with profound anemia, melena, history of Goody powders chronically.  He has  received 3 units of packed red blood cells and 2 units of FFP.  Hemoglobin 7.3.  EGD yesterday with an 8 mm elliptical prepyloric antral ulcers both appear benign.  Large area of duodenal ulceration somewhat suspicious, cannot exclude underlying malignancy.  Status post bleeding control therapy with epinephrine.  Hemostasis clip cannot be placed.  Duodenal lesion at high risk for rebleeding. If patient rebleeds, may not be able to provide definitive therapy endoscopically. Could potentially need surgery vs IR services. At very minimum, he will need a repeat EGD in 12 weeks.  Plan: 1. Follow-up gastric biopsies.  No duodenal biopsies due to active bleeding. 2. Pantoprazole drip for 72 hours.  3. Avoidance of NSAIDs/aspirin. 4. EGD in 12 weeks. 5. Begin clear liquids today, if no evidence of rebleed today, will move towards advancing diet late this afternoon.  Leanna Battles. [facility] [phone] 5/13/20209:25 AM     LOS: 2 days

## 2019-02-20 ENCOUNTER — Telehealth: Payer: Self-pay | Admitting: Gastroenterology

## 2019-02-20 LAB — GLUCOSE, CAPILLARY
Glucose-Capillary: 114 mg/dL — ABNORMAL HIGH (ref 70–99)
Glucose-Capillary: 278 mg/dL — ABNORMAL HIGH (ref 70–99)
Glucose-Capillary: 200 mg/dL — ABNORMAL HIGH (ref 70–99)
Glucose-Capillary: 133 mg/dL — ABNORMAL HIGH (ref 70–99)

## 2019-02-20 NOTE — Progress Notes (Signed)
Patient a transfer from ICU to room 312. Alert and oriented x's 4. No c/o pain or discomfort noted. Vital signs stable. Patient is med-surg. Will continue to monitor patient. Report received from [facility].

## 2019-02-20 NOTE — Telephone Encounter (Signed)
Please schedule OV for hospital follow up for 8 weeks. Reason for visit: needs repeat EGD for ulcers, consider colonoscopy, abdominal bruit.

## 2019-02-20 NOTE — Progress Notes (Signed)
At present time:Transferred to 312 via W/C, tolerated well. In bed, Side rails up x2, bed locked, and call bell in reach. Verbalized understanding to call for assistance when getting  OOB due to low HGB.  Report called to Val, Charity fundraiser  and received by Ladona Ridgel, NT. Remains stable. Protonix gtt infusing per order.See IV and pt assessment in flowsheets.

## 2019-02-20 NOTE — Progress Notes (Signed)
Subjective:  Feels good. No abd pain. Hard stool with some streaks of blood last night. No melena.   Objective: Vital signs in last 24 hours: Temp:  [98.1 F (36.7 C)-99.6 F (37.6 C)] 98.5 F (36.9 C) (05/14 0733) Pulse Rate:  [58-89] 63 (05/14 0733) Resp:  [0-24] 24 (05/14 0733) BP: (107-153)/(52-74) 128/65 (05/14 0300) SpO2:  [95 %-99 %] 98 % (05/14 0733) Last BM Date: 02/15/19 General:   Alert,  Well-developed, well-nourished, pleasant and cooperative in NAD Head:  Normocephalic and atraumatic. Eyes:  Sclera clear, no icterus.  Extremities:  Without clubbing, deformity or edema. Neurologic:  Alert and  oriented x4;  grossly normal neurologically. Skin:  Intact without significant lesions or rashes. Psych:  Alert and cooperative. Normal mood and affect.  Intake/Output from previous day: 05/13 0701 - 05/14 0700 In: 882.8 [P.O.:240; I.V.:642.8] Out: 750 [Urine:750] Intake/Output this shift: No intake/output data recorded.  Lab Results: CBC Recent Labs    02/17/19 1051 02/18/19 0540 02/18/19 1314 02/19/19 0130  WBC 6.9 5.3  --  6.0  HGB 4.7* 5.7* 7.6* 7.3*  7.3*  HCT 16.4* 18.9* 25.1* 23.9*  23.4*  MCV 102.5* 97.4  --  94.8  PLT 205 187  --  176   BMET Recent Labs    02/17/19 1051 02/18/19 0540 02/19/19 0130  NA 135 140 140  K 3.9 3.6 3.5  CL 105 109 108  CO2 21* 23 25  GLUCOSE 337* 164* 117*  BUN 16 10 8   CREATININE 0.70 0.66 0.74  CALCIUM 8.0* 8.0* 7.7*   LFTs Recent Labs    02/17/19 1051  BILITOT 0.2*  ALKPHOS 55  AST 12*  ALT 10  PROT 5.0*  ALBUMIN 2.8*   No results for input(s): LIPASE in the last 72 hours. PT/INR Recent Labs    02/17/19 1051  LABPROT 14.4  INR 1.1      Imaging Studies: No results found.[2 weeks]   Assessment: 63 year old gentleman admitted with profound anemia, melena, history of Goody powders chronically.  Received 3 units of packed red blood cells and 2 units of FFP.  Hemoglobin 7.3 yesterday.  EGD this  admission with 8 mm elliptical prepyloric antral ulcers both appear benign. Large area of duodenal ulceration somewhat suspicious, cannot exclude underlying malignancy.  Status post bleeding control therapy with epinephrine.  Hemostasis clip cannot be placed.  Duodenal lesion at high risk for rebleeding.  Small brown stool with streaks of brbpr last night, likely benign anorectal source for bleeding.   Plan: 1. Pantoprazole drip for 72 hours, then PPI twice daily for 12 weeks. 2. Avoid NSAIDs/aspirin 3. EGD in 12 weeks 4. Follow-up pending path. 5. Consider outpatient colonoscopy.  6. Work up abdominal bruit as outpatient by PCP.  Leanna Battles. [facility] [phone] 5/14/202010:09 AM     LOS: 3 days

## 2019-02-20 NOTE — Progress Notes (Signed)
PROGRESS NOTE  Joel Roberson UDJ:[id number] DOB: 02-Nov-1955 DOA: 02/17/2019 PCP: Patient, No Pcp Per  Brief History:   63 y.o. male with medical history of diabetes mellitus type 2, hypertension, hyperlipidemia, depression, iron deficiency presenting with generalized weakness and dizziness that has gradually worsened over the past 1-1/2 weeks.  The patient states that he has been taking Goody powders on a daily basis for a number of years.  In the past 1 to 2 weeks he has noted melanotic stools.  The patient had an episode of hematemesis and coffee-ground emesis on 02/15/2019.  In addition, the patient has had intermittent hematochezia for the past 1-1/2 weeks with the last episode on 02/14/2019.  He is complaining of worsening generalized weakness and lightheadedness.  He has had intermittent chest discomfort at rest as well as with exertion.   In the emergency department, the patient was afebrile hemodynamically stable saturating 100% room air.  BMP and LFTs were unremarkable.  Hemoglobin was 4.7.  GI was consulted to assist with management.  EGD was performed on 02/18/19.  Assessment/Plan: Acute blood loss anemia -Secondary GI bleed -Suspect NSAID induced peptic ulcer disease -Transfused 2 unit PRBC during this admisson -Follow hemoglobin trend. -Hemoglobin has remained stable at 7.3.  Hematochezia and melena/hematemesis -GI consult appreciated -EGD 02/18/2019--large duodenal ulcer with brisk bleed treated with EPI.  Cannot r/o malignancy -Continue Protonix drip until completion of 72 hours of IV PPI (02/21/2019). -Diet has been advanced as recommended by GI service and so far well-tolerated.  -Patient has been educated about NSAIDs discontinuation and avoidance at time of discharge. -Patient will follow-up with gastroenterology service for repeat EGD, screening colonoscopy and to follow-up biopsy results.  Chest pain -Sounds mostly atypical most likely associated with  gastroesophageal reflux disease. -troponins--neg x 3 -Echocardiogram--EF 60-65%, mild AS -personally reviewed EKG--sinus, no ST segment or T wave changes -Discontinue telemetry.  Diabetes mellitus type 2 -Hemoglobin A1c--6.2 -Continue holding metformin while inpatient. -Follow CBGs and use sliding scale insulin.  Essential hypertension -Stable and well-controlled -Continue lisinopril -Heart healthy diet has been instructed.  Hyperlipidemia -Continue statins  Anxiety/depression -Stable mood -No suicidal ideation or hallucination -Continue Celexa and Valium.     Disposition Plan:   Home in am if otherwise stable and tolerating diet.  Family Communication:   No Family at bedside  Consultants:  [facility]  Code Status:  FULL   DVT Prophylaxis:  SCDs   Procedures: EGD 02/18/2019: Demonstrating a millimeters elliptical prepyloric antral ulcer of benign appearance; there was also a large area of duodenal ulceration somewhat suspicious unable to exclude malignancy.  Patient received bleeding control therapy with epinephrine.  Unable to apply hemostasis clip.  Antibiotics: None  Subjective: No chest pain, no shortness of breath, no nausea, no vomiting.  Patient denies palpitation and reports no overt bleeding.   Objective: Vitals:   02/20/19 1133 02/20/19 1200 02/20/19 1300 02/20/19 1416  BP:  (!) 158/76 133/66 128/69  Pulse:  84 69 71  Resp:  14 20 18   Temp: 97.9 F (36.6 C)   98.1 F (36.7 C)  TempSrc: Oral   Oral  SpO2:  96% 96% 98%  Weight:    84.8 kg  Height:        Intake/Output Summary (Last 24 hours) at 02/20/2019 1713 Last data filed at 02/20/2019 1300 Gross per 24 hour  Intake 1270.09 ml  Output 875 ml  Net 395.09 ml   Weight change:  Exam: General exam: Alert, awake, oriented x 3; no chest pain, no shortness of breath, no nausea, no vomiting, no abdominal pain.  Patient reported good tolerance to full diet. Respiratory system: Clear to  auscultation. Respiratory effort normal. Cardiovascular system:RRR. No murmurs, rubs, gallops. Gastrointestinal system: Abdomen is nondistended, soft and nontender. No organomegaly or masses felt. Normal bowel sounds heard. Central nervous system: Alert and oriented. No focal neurological deficits. Extremities: No C/C/E, +pedal pulses Skin: No rashes, lesions or ulcers Psychiatry: Judgement and insight appear normal. Mood & affect appropriate.   Data Reviewed: I have personally reviewed following labs and imaging studies  Basic Metabolic Panel: Recent Labs  Lab 02/17/19 1051 02/18/19 0540 02/19/19 0130  NA 135 140 140  K 3.9 3.6 3.5  CL 105 109 108  CO2 21* 23 25  GLUCOSE 337* 164* 117*  BUN 16 10 8   CREATININE 0.70 0.66 0.74  CALCIUM 8.0* 8.0* 7.7*   Liver Function Tests: Recent Labs  Lab 02/17/19 1051  AST 12*  ALT 10  ALKPHOS 55  BILITOT 0.2*  PROT 5.0*  ALBUMIN 2.8*   Coagulation Profile: Recent Labs  Lab 02/17/19 1051  INR 1.1   CBC: Recent Labs  Lab 02/17/19 1051 02/18/19 0540 02/18/19 1314 02/19/19 0130  WBC 6.9 5.3  --  6.0  NEUTROABS 5.4  --   --   --   HGB 4.7* 5.7* 7.6* 7.3*  7.3*  HCT 16.4* 18.9* 25.1* 23.9*  23.4*  MCV 102.5* 97.4  --  94.8  PLT 205 187  --  176   Cardiac Enzymes: Recent Labs  Lab 02/17/19 1051 02/17/19 1655 02/17/19 2056  TROPONINI <0.03 <0.03 <0.03   BNP: Invalid input(s): POCBNP CBG: Recent Labs  Lab 02/19/19 1608 02/19/19 2036 02/20/19 0732 02/20/19 1131 02/20/19 1623  GLUCAP 154* 218* 114* 278* 200*   Urine analysis:    Component Value Date/Time   COLORURINE YELLOW 02/17/2019 0945   APPEARANCEUR HAZY (A) 02/17/2019 0945   LABSPEC 1.015 02/17/2019 0945   PHURINE 5.0 02/17/2019 0945   GLUCOSEU >=500 (A) 02/17/2019 0945   HGBUR NEGATIVE 02/17/2019 0945   BILIRUBINUR NEGATIVE 02/17/2019 0945   KETONESUR NEGATIVE 02/17/2019 0945   PROTEINUR NEGATIVE 02/17/2019 0945   NITRITE NEGATIVE 02/17/2019  0945   LEUKOCYTESUR TRACE (A) 02/17/2019 0945    Recent Results (from the past 240 hour(s))  SARS Coronavirus 2 (CEPHEID- Performed in [facility] lab), Hosp Order     Status: None   Collection Time: 02/17/19 12:34 PM  Result Value Ref Range Status   SARS Coronavirus 2 NEGATIVE NEGATIVE Final    Comment: (NOTE) If result is NEGATIVE SARS-CoV-2 target nucleic acids are NOT DETECTED. The SARS-CoV-2 RNA is generally detectable in upper and lower  respiratory specimens during the acute phase of infection. The lowest  concentration of SARS-CoV-2 viral copies this assay can detect is 250  copies / mL. A negative result does not preclude SARS-CoV-2 infection  and should not be used as the sole basis for treatment or other  patient management decisions.  A negative result may occur with  improper specimen collection / handling, submission of specimen other  than nasopharyngeal swab, presence of viral mutation(s) within the  areas targeted by this assay, and inadequate number of viral copies  (<250 copies / mL). A negative result must be combined with clinical  observations, patient history, and epidemiological information. If result is POSITIVE SARS-CoV-2 target nucleic acids are DETECTED. The SARS-CoV-2 RNA is generally detectable  in upper and lower  respiratory specimens dur ing the acute phase of infection.  Positive  results are indicative of active infection with SARS-CoV-2.  Clinical  correlation with patient history and other diagnostic information is  necessary to determine patient infection status.  Positive results do  not rule out bacterial infection or co-infection with other viruses. If result is PRESUMPTIVE POSTIVE SARS-CoV-2 nucleic acids MAY BE PRESENT.   A presumptive positive result was obtained on the submitted specimen  and confirmed on repeat testing.  While 2019 novel coronavirus  (SARS-CoV-2) nucleic acids may be present in the submitted sample  additional  confirmatory testing may be necessary for epidemiological  and / or clinical management purposes  to differentiate between  SARS-CoV-2 and other Sarbecovirus currently known to infect humans.  If clinically indicated additional testing with an alternate test  methodology [phone]) is advised. The SARS-CoV-2 RNA is generally  detectable in upper and lower respiratory sp ecimens during the acute  phase of infection. The expected result is Negative. Fact Sheet for Patients:  BoilerBrush.com.cy Fact Sheet for Healthcare Providers: https://pope.com/ This test is not yet approved or cleared by the [place] FDA and has been authorized for detection and/or diagnosis of SARS-CoV-2 by FDA under an Emergency Use Authorization (EUA).  This EUA will remain in effect (meaning this test can be used) for the duration of the COVID-19 declaration under Section 564(b)(1) of the Act, 21 U.S.C. section 360bbb-3(b)(1), unless the authorization is terminated or revoked sooner. Performed at [facility], [address], [place], [place] [address]   MRSA PCR Screening     Status: None   Collection Time: 02/18/19  2:42 PM  Result Value Ref Range Status   MRSA by PCR NEGATIVE NEGATIVE Final    Comment:        The GeneXpert MRSA Assay (FDA approved for NASAL specimens only), is one component of a comprehensive MRSA colonization surveillance program. It is not intended to diagnose MRSA infection nor to guide or monitor treatment for MRSA infections. Performed at [facility], [address], [place], [place] [address]      Scheduled Meds: . sodium chloride   Intravenous Once  . atorvastatin  20 mg Oral Daily  . citalopram  40 mg Oral Daily  . diazepam  10 mg Oral QHS  . insulin aspart  0-5 Units Subcutaneous QHS  . insulin aspart  0-9 Units Subcutaneous TID WC  . insulin aspart  0-9 Units Subcutaneous TID WC  . [START ON 02/22/2019] pantoprazole  40  mg Oral BID AC  . sodium chloride flush  3 mL Intravenous Once  . tamsulosin  0.4 mg Oral Daily   Continuous Infusions: . pantoprozole (PROTONIX) infusion 8 mg/hr (02/20/19 1300)    Vassie Loll, MD  [facility] Pager [phone]   02/20/2019, 5:13 PM   LOS: 3 days

## 2019-02-21 DIAGNOSIS — K219 Gastro-esophageal reflux disease without esophagitis: Secondary | ICD-10-CM

## 2019-02-21 DIAGNOSIS — I10 Essential (primary) hypertension: Secondary | ICD-10-CM

## 2019-02-21 LAB — CBC
HCT: 23.8 % — ABNORMAL LOW (ref 39.0–52.0)
Hemoglobin: 7.3 g/dL — ABNORMAL LOW (ref 13.0–17.0)
MCH: 28.6 pg (ref 26.0–34.0)
MCHC: 30.7 g/dL (ref 30.0–36.0)
MCV: 93.3 fL (ref 80.0–100.0)
Platelets: 130 K/uL — ABNORMAL LOW (ref 150–400)
RBC: 2.55 MIL/uL — ABNORMAL LOW (ref 4.22–5.81)
RDW: 17.2 % — ABNORMAL HIGH (ref 11.5–15.5)
WBC: 3.3 K/uL — ABNORMAL LOW (ref 4.0–10.5)
nRBC: 0 % (ref 0.0–0.2)

## 2019-02-21 LAB — BPAM RBC
Blood Product Expiration Date: 202006012359
Blood Product Expiration Date: 202006022359
Blood Product Expiration Date: 202006022359
Blood Product Expiration Date: 202006022359
ISSUE DATE / TIME: 202005111244
ISSUE DATE / TIME: 202005120842
ISSUE DATE / TIME: 202005122106
Unit Type and Rh: 6200
Unit Type and Rh: 6200
Unit Type and Rh: 6200
Unit Type and Rh: 6200

## 2019-02-21 LAB — TYPE AND SCREEN
ABO/RH(D): A POS
Antibody Screen: NEGATIVE
Unit division: 0
Unit division: 0
Unit division: 0
Unit division: 0

## 2019-02-21 LAB — GLUCOSE, CAPILLARY
Glucose-Capillary: 154 mg/dL — ABNORMAL HIGH (ref 70–99)
Glucose-Capillary: 230 mg/dL — ABNORMAL HIGH (ref 70–99)

## 2019-02-21 MED ORDER — PANTOPRAZOLE SODIUM 40 MG PO TBEC: 40.0000 mg | DELAYED_RELEASE_TABLET | Freq: Two times a day (BID) | ORAL | 3 refills | Status: AC

## 2019-02-21 MED ORDER — POLYSACCHARIDE IRON COMPLEX 150 MG PO CAPS: 150.0000 mg | ORAL_CAPSULE | Freq: Every day | ORAL | 2 refills | Status: AC

## 2019-02-21 NOTE — Discharge Summary (Signed)
Physician Discharge Summary  MAURIZIO GENO WUJ:[id number] DOB: 01/05/1956 DOA: 02/17/2019  PCP: Patient, No Pcp Per  Admit date: 02/17/2019 Discharge date: 02/21/2019  Time spent: 35 minutes  Recommendations for Outpatient Follow-up:  1. Repeat CBC to follow hemoglobin trend 2. Reassess blood pressure and adjust antihypertensive regimen as needed.   Discharge Diagnoses:  Active Problems:   Acute blood loss anemia   GI bleed   Chest pain   Mixed hyperlipidemia   Duodenal ulcer with hemorrhage   Gastroesophageal reflux disease   Essential hypertension   Discharge Condition: Table and improved.  Patient discharged home with instructions to avoid the use of any NSAIDs, arrange follow-up with PCP in 10 days and to follow-up with gastroenterology service as instructed.  Diet recommendation: Heart healthy diet.  Filed Weights   02/17/19 1506 02/19/19 0500 02/20/19 1416  Weight: 79.5 kg 80.3 kg 84.8 kg    History of present illness:  63 y.o. male with medical history of diabetes mellitus type 2, hypertension, hyperlipidemia, depression, iron deficiency presenting with generalized weakness and dizziness that has gradually worsened over the past 1-1/2 weeks.  The patient states that he has been taking Goody powders on a daily basis for a number of years.  In the past 1 to 2 weeks he has noted melanotic stools.  The patient had an episode of hematemesis and coffee-ground emesis on 02/15/2019.  In addition, the patient has had intermittent hematochezia for the past 1-1/2 weeks with the last episode on 02/14/2019.  He is complaining of worsening generalized weakness and lightheadedness.  He has had intermittent chest discomfort at rest as well as with exertion.  He denies any headaches, fevers, chills, abdominal pain, dysuria, hematuria.  He has remote history of tobacco, but has not smoked in over 20 years.  He denies any alcohol use.  He has never had endoscopy. In the emergency department, the  patient was afebrile hemodynamically stable saturating 100% room air.  BMP and LFTs were unremarkable.  Hemoglobin was 4.7.  GI was consulted to assist with management.  Hospital Course:  Acute blood loss anemia -Secondary GI bleed -Suspect NSAID induced peptic ulcer disease -Transfused 2 unit PRBC during this admisson -Hemoglobin has remained stable at 7.3; at discharge patient expressed no associated symptoms.  Hematochezia and melena/hematemesis -GI consult appreciated -EGD 02/18/2019--large duodenal ulcer with brisk bleed treated with EPI.  Cannot r/o malignancy -Patient completed 72 hours of IV PPI; discharged on Protonix 40 mg twice a day. -Diet was been advanced as recommended by GI service and so far well-tolerated.  -Patient has been educated about NSAIDs discontinuation and avoidance at time of discharge. -Patient will follow-up with gastroenterology service for repeat EGD, screening colonoscopy and to follow-up biopsy results.  Chest pain -Sounds mostly atypical most likely associated with gastroesophageal reflux disease. -troponins--neg x 3 -Echocardiogram--EF 60-65%, mild AS -personally reviewed EKG--sinus, no ST segment or T wave changes -No abnormalities appreciated on telemetry -Patient advised to follow heart healthy diet.  Diabetes mellitus type 2 -Hemoglobin A1c--6.2 -Resume the use of metformin and encouraged modified carbohydrate diet.  Essential hypertension -Stable and well-controlled -Continue lisinopril -Heart healthy diet has been instructed.  Hyperlipidemia -Continue statins  Anxiety/depression -Stable mood -No suicidal ideation or hallucination -Continue Celexa and Valium.    Procedures:  EGD: 02/18/2019 demonstrating 8 mm elliptical prepyloric antral ulcer of benign appearance; there was also a large area of duodenal ulceration somewhat suspicious unable to slow malignancy.  Patient received bleeding control therapy with epinephrine.  Unable to apply hemostasis clip.  Consultations:  Gastroenterology service.  Discharge Exam: Vitals:   02/20/19 2111 02/21/19 0517  BP: 128/68 (!) 142/72  Pulse: 62 61  Resp: 20 16  Temp: 98.4 F (36.9 C) 98.1 F (36.7 C)  SpO2: 98% 98%   General exam: Alert, awake, oriented x 3; no chest pain, no shortness of breath, no nausea, no vomiting, no abdominal pain.  Patient reported good tolerance to full diet. Respiratory system: Clear to auscultation. Respiratory effort normal. Cardiovascular system:RRR. No murmurs, rubs, gallops. Gastrointestinal system: Abdomen is nondistended, soft and nontender. No organomegaly or masses felt. Normal bowel sounds heard. Central nervous system: Alert and oriented. No focal neurological deficits. Extremities: No C/C/E, +pedal pulses Skin: No rashes, lesions or ulcers Psychiatry: Judgement and insight appear normal. Mood & affect appropriate.   Discharge Instructions   Discharge Instructions    Diet - low sodium heart healthy   Complete by:  As directed    Discharge instructions   Complete by:  As directed    Take medications as prescribed Keep yourself well-hydrated Avoid the use of any NSAIDs (aspirin, Motrin, ibuprofen, naproxen, Advil, Aleve, Excedrin, Goody powders, etc.) Follow-up with gastroenterology service as instructed Arrange follow-up with PCP in 10 days.   Increase activity slowly   Complete by:  As directed      Allergies as of 02/21/2019   No Known Allergies     Medication List    STOP taking these medications   Aspirin-Acetaminophen-Caffeine 500-325-65 MG Pack   Iron 325 (65 Fe) MG Tabs     TAKE these medications   atorvastatin 20 MG tablet Commonly known as:  LIPITOR Take 20 mg by mouth daily.   citalopram 40 MG tablet Commonly known as:  CELEXA Take 40 mg by mouth daily.   diazepam 10 MG tablet Commonly known as:  VALIUM Take 10 mg by mouth at bedtime.   iron polysaccharides 150 MG capsule Commonly  known as:  NIFEREX Take 1 capsule (150 mg total) by mouth daily.   lisinopril 10 MG tablet Commonly known as:  ZESTRIL Take 10 mg by mouth daily.   metFORMIN 1000 MG tablet Commonly known as:  GLUCOPHAGE Take 1,000 mg by mouth 2 (two) times daily.   pantoprazole 40 MG tablet Commonly known as:  PROTONIX Take 1 tablet (40 mg total) by mouth 2 (two) times daily before a meal. Start taking on:  Feb 22, 2019   tamsulosin 0.4 MG Caps capsule Commonly known as:  FLOMAX Take 0.4 mg by mouth daily.      No Known Allergies    The results of significant diagnostics from this hospitalization (including imaging, microbiology, ancillary and laboratory) are listed below for reference.    Significant Diagnostic Studies: No results found.  Microbiology: Recent Results (from the past 240 hour(s))  SARS Coronavirus 2 (CEPHEID- Performed in [facility] hospital lab), Hosp Order     Status: None   Collection Time: 02/17/19 12:34 PM  Result Value Ref Range Status   SARS Coronavirus 2 NEGATIVE NEGATIVE Final    Comment: (NOTE) If result is NEGATIVE SARS-CoV-2 target nucleic acids are NOT DETECTED. The SARS-CoV-2 RNA is generally detectable in upper and lower  respiratory specimens during the acute phase of infection. The lowest  concentration of SARS-CoV-2 viral copies this assay can detect is 250  copies / mL. A negative result does not preclude SARS-CoV-2 infection  and should not be used as the sole basis for treatment or other  patient management decisions.  A negative result may occur with  improper specimen collection / handling, submission of specimen other  than nasopharyngeal swab, presence of viral mutation(s) within the  areas targeted by this assay, and inadequate number of viral copies  (<250 copies / mL). A negative result must be combined with clinical  observations, patient history, and epidemiological information. If result is POSITIVE SARS-CoV-2 target nucleic acids  are DETECTED. The SARS-CoV-2 RNA is generally detectable in upper and lower  respiratory specimens dur ing the acute phase of infection.  Positive  results are indicative of active infection with SARS-CoV-2.  Clinical  correlation with patient history and other diagnostic information is  necessary to determine patient infection status.  Positive results do  not rule out bacterial infection or co-infection with other viruses. If result is PRESUMPTIVE POSTIVE SARS-CoV-2 nucleic acids MAY BE PRESENT.   A presumptive positive result was obtained on the submitted specimen  and confirmed on repeat testing.  While 2019 novel coronavirus  (SARS-CoV-2) nucleic acids may be present in the submitted sample  additional confirmatory testing may be necessary for epidemiological  and / or clinical management purposes  to differentiate between  SARS-CoV-2 and other Sarbecovirus currently known to infect humans.  If clinically indicated additional testing with an alternate test  methodology [phone]) is advised. The SARS-CoV-2 RNA is generally  detectable in upper and lower respiratory sp ecimens during the acute  phase of infection. The expected result is Negative. Fact Sheet for Patients:  BoilerBrush.com.cy Fact Sheet for Healthcare Providers: https://pope.com/ This test is not yet approved or cleared by the [place] FDA and has been authorized for detection and/or diagnosis of SARS-CoV-2 by FDA under an Emergency Use Authorization (EUA).  This EUA will remain in effect (meaning this test can be used) for the duration of the COVID-19 declaration under Section 564(b)(1) of the Act, 21 U.S.C. section 360bbb-3(b)(1), unless the authorization is terminated or revoked sooner. Performed at [facility], [address], [place], [place] [address]   MRSA PCR Screening     Status: None   Collection Time: 02/18/19  2:42 PM  Result Value Ref Range Status    MRSA by PCR NEGATIVE NEGATIVE Final    Comment:        The GeneXpert MRSA Assay (FDA approved for NASAL specimens only), is one component of a comprehensive MRSA colonization surveillance program. It is not intended to diagnose MRSA infection nor to guide or monitor treatment for MRSA infections. Performed at [facility], [address] [place], [place] [address]      Labs: Basic Metabolic Panel: Recent Labs  Lab 02/17/19 1051 02/18/19 0540 02/19/19 0130  NA 135 140 140  K 3.9 3.6 3.5  CL 105 109 108  CO2 21* 23 25  GLUCOSE 337* 164* 117*  BUN CREATININE 0.70 0.66 0.74  CALCIUM 8.0* 8.0* 7.7*   Liver Function Tests: Recent Labs  Lab 02/17/19 1051  AST 12*  ALT 10  ALKPHOS 55  BILITOT 0.2*  PROT 5.0*  ALBUMIN 2.8*   CBC: Recent Labs  Lab 02/17/19 1051 02/18/19 0540 02/18/19 1314 02/19/19 0130 02/21/19 0619  WBC 6.9 5.3  --  6.0 3.3*  NEUTROABS 5.4  --   --   --   --   HGB 4.7* 5.7* 7.6* 7.3*  7.3* 7.3*  HCT 16.4* 18.9* 25.1* 23.9*  23.4* 23.8*  MCV 102.5* 97.4  --  94.8 93.3  PLT 205 187  --  176 130*   Cardiac Enzymes: Recent Labs  Lab 02/17/19 1051 02/17/19 1655 02/17/19 2056  TROPONINI <0.03 <0.03 <0.03   CBG: Recent Labs  Lab 02/20/19 1131 02/20/19 1623 02/20/19 2056 02/21/19 0735 02/21/19 1118  GLUCAP 278* 200* 133* 154* 230*    Signed:  Vassie Loll MD.  [facility] 02/21/2019, 12:53 PM

## 2019-02-21 NOTE — Progress Notes (Signed)
[name] to be D/C'd per MD order. Discussed with the patient and all questions fully answered. ? VSS, Skin clean, dry and intact without evidence of skin break down, no evidence of skin tears noted. ? IV catheter discontinued intact. Site without signs and symptoms of complications. Dressing and pressure applied. ? An After Visit Summary was printed and given to the patient. Patient informed where to pickup prescriptions. ? D/C education completed with patient/family including follow up instructions, medication list, d/c activities limitations if indicated, with other d/c instructions as indicated by MD - patient able to verbalize understanding, all questions fully answered.  ? Patient instructed to return to ED, call 911, or call MD for any changes in condition.  ? Patient to be escorted via WC, and D/C home via private auto.

## 2019-02-22 ENCOUNTER — Encounter: Payer: Self-pay | Admitting: Internal Medicine

## 2019-02-24 ENCOUNTER — Encounter: Payer: Self-pay | Admitting: Internal Medicine

## 2019-02-24 NOTE — Telephone Encounter (Signed)
PATIENT SCHEDULED  °

## 2019-05-08 ENCOUNTER — Encounter: Payer: Self-pay | Admitting: Gastroenterology

## 2019-05-08 ENCOUNTER — Other Ambulatory Visit: Payer: Self-pay

## 2019-05-08 ENCOUNTER — Ambulatory Visit: Payer: BC Managed Care – PPO | Admitting: Gastroenterology

## 2019-05-08 VITALS — BP 118/70 | HR 85 | Temp 97.5°F | Ht 74.0 in | Wt 167.2 lb

## 2019-05-08 DIAGNOSIS — D509 Iron deficiency anemia, unspecified: Secondary | ICD-10-CM | POA: Insufficient documentation

## 2019-05-08 DIAGNOSIS — D500 Iron deficiency anemia secondary to blood loss (chronic): Secondary | ICD-10-CM

## 2019-05-08 DIAGNOSIS — K264 Chronic or unspecified duodenal ulcer with hemorrhage: Secondary | ICD-10-CM | POA: Diagnosis not present

## 2019-05-08 NOTE — Assessment & Plan Note (Signed)
Pleasant 63 year old gentleman with history of iron deficiency anemia, initially discovered in December 2019, presenting to the ED back in May and found to have profound anemia with hemoglobin in the 4 range.  Complained of melena and coffee-ground emesis.  Iron and B12 was low as well.  EGD showed couple of small benign appearing prepyloric ulcers and a large area of ulceration in the duodenum somewhat suspicious appearing, cannot exclude underlying malignancy.  Status post bleeding control.  Gastric biopsies benign.  Duodenal lesion was not biopsied due to GI bleeding.  Plans are for 12-week follow-up EGD to verify ulcer healing and to reevalute large duodenal ulceration to make sure no underlying malignancy.  Also needs colonoscopy.  Patient presents today stating that he does not want to have any procedures or blood work done because of money.  I explained that he may have an underlying malignancy and delaying treatment could be life-threatening.  He is also not taking his pantoprazole on a regular basis.  He has lost 20 pounds since May on top of previous weight loss.   At this time patient declines EGD, colonoscopy, blood work.  I will send a letter back to his PCP regarding patient's decision.  Also will make sure PCP aware that patient has a history of B12 deficiency notated during his hospitalization.

## 2019-05-08 NOTE — Patient Instructions (Addendum)
1. It is recommended that you have a repeat upper endoscopy to make sure your ulcers have healed and to make sure the large one has no underlying cancer.  2. It is recommended that you have a colonoscopy because of your iron deficiency anemia.  3. You should consider having updated anemia labs in one month. We can order those or your PCP.  4. You have lost 20 pounds since 02/2019, this is concerning.  5. Please let us know if you change your mind and would like to pursue and of the above work up.

## 2019-05-08 NOTE — Progress Notes (Signed)
Primary Care Physician:  Bernita BuffyWilliams, Breejante J, PA-C  Primary Gastroenterologist:  Roetta SessionsMichael Rourk, MD   Chief Complaint  Patient presents with  . Follow-up    Hospital follow up/ doing much better    HPI:  Joel Roberson is a 63 y.o. male here for follow-up recent hospitalization.  He presented back in May with fatigue, melena, hematemesis.  His hemoglobin was 4.7.  First noted to have iron deficiency anemia back in January.  Reports several month history of melena.  Reported mild epigastric pain.  Endorsed 5-6 Goody powders a day chronically for chronic low back pain.  EGD showed two 8 mm elliptical ulcers with clean bases and appear benign.  Large area of duodenal ulceration somewhat suspicious, cannot exclude underlying malignancy.  Gastric biopsies benign.  No H. pylori.  Status post bleeding control of duodenal lesion.  12-week follow-up EGD recommended.  No prior colonoscopy, recommended outpatient colonoscopy for IDA.   While inpatient he is found to have low B12 of 121.  Folate was normal.    Current Outpatient Medications  Medication Sig Dispense Refill  . atorvastatin (LIPITOR) 20 MG tablet Take 20 mg by mouth daily.    . citalopram (CELEXA) 40 MG tablet Take 40 mg by mouth daily.    . diazepam (VALIUM) 10 MG tablet Take 10 mg by mouth at bedtime.    . iron polysaccharides (NIFEREX) 150 MG capsule Take 1 capsule (150 mg total) by mouth daily. 60 capsule 2  . lisinopril (ZESTRIL) 10 MG tablet Take 10 mg by mouth daily.    . metFORMIN (GLUCOPHAGE) 1000 MG tablet Take 1,000 mg by mouth 2 (two) times daily.    . pantoprazole (PROTONIX) 40 MG tablet Take 1 tablet (40 mg total) by mouth 2 (two) times daily before a meal. 60 tablet 3  . tamsulosin (FLOMAX) 0.4 MG CAPS capsule Take 0.4 mg by mouth daily.     No current facility-administered medications for this visit.     Allergies as of 05/08/2019  . (No Known Allergies)    ROS:  General: Negative for anorexia,fever, chills,  fatigue, weakness.  Weight down 20 pounds since May.  Patient states he used to weigh over 200 pounds but cannot tell me over what timeframe. Eyes: Negative for vision changes.  ENT: Negative for hoarseness, difficulty swallowing , nasal congestion. CV: Negative for chest pain, angina, palpitations, dyspnea on exertion, peripheral edema.  Respiratory: Negative for dyspnea at rest, dyspnea on exertion, cough, sputum, wheezing.  GI: See history of present illness. GU:  Negative for dysuria, hematuria, urinary incontinence, urinary frequency, nocturnal urination.  MS: Negative for joint pain, low back pain.  Derm: Negative for rash or itching.  Neuro: Negative for weakness, abnormal sensation, seizure, frequent headaches, memory loss, confusion.  Psych: Negative for anxiety, depression, suicidal ideation, hallucinations.  Endo: Negative for unusual weight change.  Heme: Negative for bruising or bleeding. Allergy: Negative for rash or hives.    Physical Examination:  BP 118/70   Pulse 85   Temp (!) 97.5 F (36.4 C) (Temporal)   Ht 6\' 2"  (1.88 m)   Wt 167 lb 3.2 oz (75.8 kg)   BMI 21.47 kg/m    General: Well-nourished, well-developed in no acute distress.  Head: Normocephalic, atraumatic.   Eyes: Conjunctiva pink, no icterus. Neck: Supple without thyromegaly, masses, or lymphadenopathy.  Lungs: Clear to auscultation bilaterally.  Heart: Regular rate and rhythm, no murmurs rubs or gallops.  Abdomen: Bowel sounds are normal, nontender, nondistended, no  hepatosplenomegaly or masses, no abdominal bruits or    hernia , no rebound or guarding.   Rectal: Not performed Extremities: No lower extremity edema. No clubbing or deformities.  Neuro: Alert and oriented x 4 , grossly normal neurologically.  Skin: Warm and dry, no rash or jaundice.   Psych: Alert and cooperative, normal mood and affect.  Labs: Patient presented to our facility on May 11 his hemoglobin was 4.7, hematocrit 16.4, MCV  102.5.  When he left on May 15 his hemoglobin was 7.3, hematocrit 23.8.  Lab Results  Component Value Date   VITAMINB12 121 (L) 02/17/2019   Lab Results  Component Value Date   FOLATE 11.1 02/17/2019   Lab Results  Component Value Date   IRON 21 (L) 02/17/2019   TIBC 366 02/17/2019   FERRITIN 9 (L) 02/17/2019     B12 121 on 02/2019  Labs from March 12, 2019: Hemoglobin 9.7, hematocrit 31.4, MCV 79.7.  January 2020 hemoglobin 11.4.  December 2019 hemoglobin 9.4.  April 2019 hemoglobin 14.3.  January 2020 iron 37, ferritin 11, TIBC 432, iron saturation 9%.  December 2019 ferritin 5  Imaging Studies: No results found.

## 2019-05-13 NOTE — Progress Notes (Signed)
CC'ED TO PCP 

## 2020-06-08 ENCOUNTER — Telehealth: Payer: Self-pay | Admitting: *Deleted

## 2020-06-08 NOTE — Telephone Encounter (Signed)
Error
